# Patient Record
Sex: Female | Born: 2000 | Hispanic: Yes | Marital: Single | Smoking: Never smoker
Health system: Southern US, Community
[De-identification: ages and names within clinical notes are randomized; demographics above are authoritative.]

## PROBLEM LIST (undated history)

## (undated) DIAGNOSIS — S060X0A Concussion without loss of consciousness, initial encounter: Secondary | ICD-10-CM

## (undated) DIAGNOSIS — O24419 Gestational diabetes mellitus in pregnancy, unspecified control: Secondary | ICD-10-CM

## (undated) HISTORY — DX: Concussion without loss of consciousness, initial encounter: S06.0X0A

## (undated) HISTORY — DX: Gestational diabetes mellitus in pregnancy, unspecified control: O24.419

## (undated) HISTORY — PX: CHOLECYSTECTOMY: SHX55

---
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
## 2021-11-03 DIAGNOSIS — N926 Irregular menstruation, unspecified: Secondary | ICD-10-CM | POA: Diagnosis not present

## 2021-11-19 DIAGNOSIS — O209 Hemorrhage in early pregnancy, unspecified: Secondary | ICD-10-CM | POA: Diagnosis not present

## 2021-11-19 DIAGNOSIS — Z3A10 10 weeks gestation of pregnancy: Secondary | ICD-10-CM | POA: Diagnosis not present

## 2021-11-19 DIAGNOSIS — O26891 Other specified pregnancy related conditions, first trimester: Secondary | ICD-10-CM | POA: Diagnosis not present

## 2021-11-19 DIAGNOSIS — N939 Abnormal uterine and vaginal bleeding, unspecified: Secondary | ICD-10-CM | POA: Diagnosis not present

## 2021-11-19 DIAGNOSIS — Z3A01 Less than 8 weeks gestation of pregnancy: Secondary | ICD-10-CM | POA: Diagnosis not present

## 2021-12-13 ENCOUNTER — Encounter: Payer: Self-pay | Admitting: Obstetrics and Gynecology

## 2021-12-13 NOTE — Progress Notes (Unsigned)
   History:   Kristina Ferguson is a 21 y.o. G1P0 at [redacted]w[redacted]d early ultrasound being seen today for her first obstetrical visit.    Patient reports no complaints.      HISTORY: OB History  Gravida Para Term Preterm AB Living  1 0 0 0 0 0  SAB IAB Ectopic Multiple Live Births  0 0 0 0 0    # Outcome Date GA Lbr Len/2nd Weight Sex Delivery Anes PTL Lv  1 Current              Past Medical History:  Diagnosis Date   Concussion without loss of consciousness 09/24/2014   Past Surgical History:  Procedure Laterality Date   CHOLECYSTECTOMY     Family History  Problem Relation Age of Onset   Hypertension Mother    Stroke Maternal Grandmother    Hypertension Maternal Grandmother    Social History   Tobacco Use   Smoking status: Never   Smokeless tobacco: Never  Vaping Use   Vaping Use: Never used  Substance Use Topics   Alcohol use: No   Drug use: No   No Known Allergies Current Outpatient Medications on File Prior to Visit  Medication Sig Dispense Refill   Prenatal Vit-Fe Fumarate-FA (PRENATAL VITAMINS PO) Take by mouth.     No current facility-administered medications on file prior to visit.    Review of Systems Pertinent items noted in HPI and remainder of comprehensive ROS otherwise negative.  Physical Exam:   Vitals:   12/15/21 0924  BP: 130/76  Pulse: 80  Weight: 262 lb (118.8 kg)   Fetal Heart Rate (bpm): 176 General: well-developed, well-nourished female in no acute distress  Breasts:  Deferred  Skin: normal coloration and turgor, no rashes  Neurologic: oriented, normal, negative, normal mood  Extremities: normal strength, tone, and muscle mass, ROM of all joints is normal  HEENT PERRLA, extraocular movement intact and sclera clear, anicteric  Neck supple and no masses  Cardiovascular: regular rate and rhythm  Respiratory:  no respiratory distress, normal breath sounds  Abdomen: soft, non-tender; bowel sounds normal; no masses,  no  organomegaly  Pelvic: Deferred    Assessment:    Pregnancy: No obstetric history on file. Patient Active Problem List   Diagnosis Date Noted   Supervision of normal first pregnancy, antepartum 12/15/2021   Obesity (BMI 30-39.9) 09/17/2014     Plan:    1. Encounter for supervision of normal first pregnancy in first trimester Initial labs drawn. Discussed ldASA at 12 weeks for obesity and primiparous. Continue prenatal vitamins. Problem list reviewed and updated. Genetic Screening discussed, NIPS: ordered. Ultrasound discussed; fetal anatomic survey: ordered. Anticipatory guidance about prenatal visits given including labs, ultrasounds, and testing. Discussed usage of Babyscripts and virtual visits  The nature of [place] [facility] with multiple MDs and Advanced Practice Providers was explained to patient; also emphasized that residents, students are part of our team. Routine obstetric precautions reviewed. Encouraged to seek out care at office or emergency room [facility] preferred) for urgent and/or emergent concerns. Return in about 4 weeks (around 01/12/2022) for OB VISIT, MD or APP.    Milas Hock, MD, FACOG Obstetrician & Gynecologist, [facility] for [facility], [facility]

## 2021-12-15 ENCOUNTER — Encounter: Payer: Self-pay | Admitting: General Practice

## 2021-12-15 ENCOUNTER — Other Ambulatory Visit (HOSPITAL_COMMUNITY)
Admission: RE | Admit: 2021-12-15 | Discharge: 2021-12-15 | Disposition: A | Discharge status: Home / Self Care | Payer: Medicaid Other | Source: Ambulatory Visit | Attending: Obstetrics and Gynecology | Admitting: Obstetrics and Gynecology

## 2021-12-15 ENCOUNTER — Ambulatory Visit (INDEPENDENT_AMBULATORY_CARE_PROVIDER_SITE_OTHER): Payer: Medicaid Other | Admitting: Obstetrics and Gynecology

## 2021-12-15 ENCOUNTER — Encounter: Payer: Self-pay | Admitting: Obstetrics and Gynecology

## 2021-12-15 VITALS — BP 130/76 | HR 80 | Wt 262.0 lb

## 2021-12-15 DIAGNOSIS — O0990 Supervision of high risk pregnancy, unspecified, unspecified trimester: Secondary | ICD-10-CM | POA: Insufficient documentation

## 2021-12-15 DIAGNOSIS — E669 Obesity, unspecified: Secondary | ICD-10-CM

## 2021-12-15 DIAGNOSIS — Z3401 Encounter for supervision of normal first pregnancy, first trimester: Secondary | ICD-10-CM

## 2021-12-15 DIAGNOSIS — Z3A11 11 weeks gestation of pregnancy: Secondary | ICD-10-CM

## 2021-12-15 DIAGNOSIS — Z3400 Encounter for supervision of normal first pregnancy, unspecified trimester: Secondary | ICD-10-CM | POA: Diagnosis not present

## 2021-12-15 DIAGNOSIS — Z789 Other specified health status: Secondary | ICD-10-CM | POA: Diagnosis not present

## 2021-12-15 MED ORDER — ASPIRIN 81 MG PO TBEC: 81.0000 mg | DELAYED_RELEASE_TABLET | Freq: Every day | ORAL | 3 refills | Status: DC

## 2021-12-16 LAB — CBC/D/PLT+RPR+RH+ABO+RUBIGG...
Antibody Screen: NEGATIVE
Basophils Absolute: 0 10*3/uL (ref 0.0–0.2)
Basos: 1 %
EOS (ABSOLUTE): 0.3 10*3/uL (ref 0.0–0.4)
Eos: 3 %
HCV Ab: NONREACTIVE
HIV Screen 4th Generation wRfx: NONREACTIVE
Hematocrit: 37.3 % (ref 34.0–46.6)
Hemoglobin: 13.1 g/dL (ref 11.1–15.9)
Hepatitis B Surface Ag: NEGATIVE
Immature Grans (Abs): 0 10*3/uL (ref 0.0–0.1)
Immature Granulocytes: 0 %
Lymphocytes Absolute: 2.5 10*3/uL (ref 0.7–3.1)
Lymphs: 30 %
MCH: 31 pg (ref 26.6–33.0)
MCHC: 35.1 g/dL (ref 31.5–35.7)
MCV: 88 fL (ref 79–97)
Monocytes Absolute: 0.7 10*3/uL (ref 0.1–0.9)
Monocytes: 9 %
Neutrophils Absolute: 4.8 10*3/uL (ref 1.4–7.0)
Neutrophils: 57 %
Platelets: 359 10*3/uL (ref 150–450)
RBC: 4.23 x10E6/uL (ref 3.77–5.28)
RDW: 12.9 % (ref 11.7–15.4)
RPR Ser Ql: NONREACTIVE
Rh Factor: POSITIVE
Rubella Antibodies, IGG: 2.11 index (ref >0.99)
WBC: 8.3 10*3/uL (ref 3.4–10.8)

## 2021-12-16 LAB — HCV INTERPRETATION

## 2021-12-16 LAB — HEMOGLOBIN A1C
Est. average glucose Bld gHb Est-mCnc: 117 mg/dL
Hgb A1c MFr Bld: 5.7 % — ABNORMAL HIGH (ref 4.8–5.6)

## 2021-12-16 LAB — GC/CHLAMYDIA PROBE AMP (~~LOC~~) NOT AT ARMC
Chlamydia: NEGATIVE
Comment: NEGATIVE
Comment: NORMAL
Neisseria Gonorrhea: NEGATIVE

## 2021-12-17 ENCOUNTER — Telehealth: Payer: Self-pay

## 2021-12-17 LAB — CULTURE, OB URINE

## 2021-12-17 LAB — URINE CULTURE, OB REFLEX

## 2021-12-17 NOTE — Telephone Encounter (Signed)
Called patient and informed her that her hgb A1c was elevated and Dr. Para March would like for her to do an early glucola.   Patient phone call ended before I could schedule her.  I attempted to call her back and it went to voicemail Armandina Stammer RN

## 2021-12-17 NOTE — Telephone Encounter (Signed)
-----   Message from Milas Hock, MD sent at 12/16/2021  6:01 PM EDT ----- She needs to come in for early 2 hr, thanks, pad

## 2021-12-19 LAB — PANORAMA PRENATAL TEST FULL PANEL:PANORAMA TEST PLUS 5 ADDITIONAL MICRODELETIONS: FETAL FRACTION: 4.1

## 2021-12-22 ENCOUNTER — Ambulatory Visit (INDEPENDENT_AMBULATORY_CARE_PROVIDER_SITE_OTHER): Payer: Medicaid Other

## 2021-12-22 ENCOUNTER — Encounter: Payer: Self-pay | Admitting: Obstetrics and Gynecology

## 2021-12-22 DIAGNOSIS — R7309 Other abnormal glucose: Secondary | ICD-10-CM

## 2021-12-22 DIAGNOSIS — Z3A12 12 weeks gestation of pregnancy: Secondary | ICD-10-CM | POA: Diagnosis not present

## 2021-12-22 DIAGNOSIS — Z3402 Encounter for supervision of normal first pregnancy, second trimester: Secondary | ICD-10-CM

## 2021-12-22 NOTE — Progress Notes (Signed)
Pt presents for early 2 hr GTT.Pt was sent to the lab for Glucose test. Rayanne Padmanabhan l Hridhaan Yohn, CMA

## 2021-12-23 ENCOUNTER — Encounter: Payer: Self-pay | Admitting: Family Medicine

## 2021-12-23 ENCOUNTER — Telehealth: Payer: Self-pay

## 2021-12-23 DIAGNOSIS — O24410 Gestational diabetes mellitus in pregnancy, diet controlled: Secondary | ICD-10-CM

## 2021-12-23 DIAGNOSIS — O24419 Gestational diabetes mellitus in pregnancy, unspecified control: Secondary | ICD-10-CM | POA: Insufficient documentation

## 2021-12-23 LAB — GLUCOSE TOLERANCE, 2 HOURS W/ 1HR
Glucose, 1 hour: 125 mg/dL (ref 70–179)
Glucose, 2 hour: 105 mg/dL (ref 70–152)
Glucose, Fasting: 94 mg/dL — ABNORMAL HIGH (ref 70–91)

## 2021-12-23 MED ORDER — ACCU-CHEK GUIDE VI STRP: ORAL_STRIP | 12 refills | Status: DC

## 2021-12-23 MED ORDER — ACCU-CHEK SOFTCLIX LANCETS MISC: 12 refills | Status: DC

## 2021-12-23 MED ORDER — ACCU-CHEK GUIDE W/DEVICE KIT: PACK | 0 refills | Status: DC

## 2021-12-23 NOTE — Telephone Encounter (Signed)
Testing supplies were sent to her pharmacy. Trejuan Matherne l Tayten Heber, CMA

## 2021-12-23 NOTE — Telephone Encounter (Signed)
-----   Message from Reva Bores, MD sent at 12/23/2021  2:51 PM EDT ----- Has GDM--please book for D and N mgmt

## 2021-12-23 NOTE — Telephone Encounter (Signed)
Called patient to inform her that she failed her early GTT. Patient made aware that she has Gestational Diabetes and will be be referred to Diabetes education. Understanding was voiced. Emmerson Taddei l Wilfrid Hyser, CMA

## 2021-12-24 ENCOUNTER — Encounter: Payer: Medicaid Other | Admitting: Registered"

## 2021-12-30 LAB — HORIZON CUSTOM: REPORT SUMMARY: NEGATIVE

## 2022-01-15 ENCOUNTER — Ambulatory Visit (INDEPENDENT_AMBULATORY_CARE_PROVIDER_SITE_OTHER): Payer: Medicaid Other | Admitting: Obstetrics & Gynecology

## 2022-01-15 VITALS — BP 135/79 | HR 91 | Wt 266.0 lb

## 2022-01-15 DIAGNOSIS — O24410 Gestational diabetes mellitus in pregnancy, diet controlled: Secondary | ICD-10-CM

## 2022-01-15 DIAGNOSIS — O0992 Supervision of high risk pregnancy, unspecified, second trimester: Secondary | ICD-10-CM

## 2022-01-15 DIAGNOSIS — R82998 Other abnormal findings in urine: Secondary | ICD-10-CM

## 2022-01-15 DIAGNOSIS — Z3A15 15 weeks gestation of pregnancy: Secondary | ICD-10-CM | POA: Diagnosis not present

## 2022-01-15 LAB — POCT URINALYSIS DIPSTICK OB
Bilirubin, UA: NEGATIVE
Glucose, UA: NEGATIVE
Ketones, UA: NEGATIVE
Nitrite, UA: NEGATIVE
Spec Grav, UA: 1.025 (ref 1.010–1.025)
Urobilinogen, UA: 0.2 E.U./dL
pH, UA: 5 (ref 5.0–8.0)

## 2022-01-15 NOTE — Addendum Note (Signed)
Addended by: Lorelle Gibbs L on: 01/15/2022 03:22 PM   Modules accepted: Orders

## 2022-01-15 NOTE — Progress Notes (Signed)
   PRENATAL VISIT NOTE  Subjective:  Kristina Ferguson is a 21 y.o. G1P0 at [redacted]w[redacted]d being seen today for ongoing prenatal care.  She is currently monitored for the following issues for this high-risk pregnancy and has Morbid obesity (HCC); Supervision of high-risk pregnancy; and Gestational diabetes on their problem list.  Patient reports no complaints.  She is not taking ASA as it causes her headaches.  Contractions: Not present. Vag. Bleeding: None.  Movement: Absent. Denies leaking of fluid.   The following portions of the patient's history were reviewed and updated as appropriate: allergies, current medications, past family history, past medical history, past social history, past surgical history and problem list.   Objective:   Vitals:   01/15/22 1421  BP: 135/79  Pulse: 91  Weight: 266 lb (120.7 kg)    Fetal Status: Fetal Heart Rate (bpm): 155   Movement: Absent     General:  Alert, oriented and cooperative. Patient is in no acute distress.  Skin: Skin is warm and dry. No rash noted.   Cardiovascular: Normal heart rate noted  Respiratory: Normal respiratory effort, no problems with respiration noted  Abdomen: Soft, gravid, appropriate for gestational age.  Pain/Pressure: Absent     Pelvic: Cervical exam deferred        Extremities: Normal range of motion.  Edema: None  Mental Status: Normal mood and affect. Normal behavior. Normal judgment and thought content.   Assessment and Plan:  Pregnancy: G1P0 at [redacted]w[redacted]d 1. Diet controlled gestational diabetes mellitus (GDM), antepartum Did not bring log or meter but reports good BS. Told to bring next visit for review. Anatomy scan scheduled, will schedule fetal ECHO given early diagnosis. Aspirin added to allergy list given reported headaches, this happens even when she takes it with food. - US Fetal Echocardiography; Future  2. [redacted] weeks gestation of pregnancy 3. Supervision of high risk pregnancy in second trimester - AFP,  Serum, Open Spina Bifida No other complaints or concerns.  Routine obstetric precautions reviewed. Please refer to After Visit Summary for other counseling recommendations.   Return in about 4 weeks (around 02/12/2022) for OFFICE OB VISIT (MD only).  Future Appointments  Date Time Provider Department Center  02/06/2022 12:45 PM [facility] NURSE [facility]  02/06/2022  1:00 PM [facility] US1 [facility]  02/12/2022  2:30 PM St[place], Rhona Raider, DO [facility] None    Jaynie Collins, MD

## 2022-01-17 LAB — AFP, SERUM, OPEN SPINA BIFIDA
AFP MoM: 0.69
AFP Value: 16.2 ng/mL
Gest. Age on Collection Date: 15.6 weeks
Maternal Age At EDD: 20.6 yr
OSBR Risk 1 IN: 10000
Test Results:: NEGATIVE
Weight: 266 [lb_av]

## 2022-01-17 LAB — CULTURE, OB URINE

## 2022-01-17 LAB — URINE CULTURE, OB REFLEX

## 2022-02-06 ENCOUNTER — Ambulatory Visit: Payer: Medicaid Other | Admitting: *Deleted

## 2022-02-06 ENCOUNTER — Other Ambulatory Visit: Payer: Self-pay | Admitting: *Deleted

## 2022-02-06 ENCOUNTER — Ambulatory Visit: Payer: Medicaid Other | Attending: Obstetrics and Gynecology

## 2022-02-06 ENCOUNTER — Ambulatory Visit: Payer: Medicaid Other | Attending: Obstetrics and Gynecology | Admitting: Obstetrics and Gynecology

## 2022-02-06 VITALS — BP 128/73 | HR 91

## 2022-02-06 DIAGNOSIS — O24410 Gestational diabetes mellitus in pregnancy, diet controlled: Secondary | ICD-10-CM | POA: Diagnosis not present

## 2022-02-06 DIAGNOSIS — O99212 Obesity complicating pregnancy, second trimester: Secondary | ICD-10-CM

## 2022-02-06 DIAGNOSIS — O24419 Gestational diabetes mellitus in pregnancy, unspecified control: Secondary | ICD-10-CM

## 2022-02-06 DIAGNOSIS — Z3A19 19 weeks gestation of pregnancy: Secondary | ICD-10-CM

## 2022-02-06 DIAGNOSIS — Z3400 Encounter for supervision of normal first pregnancy, unspecified trimester: Secondary | ICD-10-CM

## 2022-02-06 DIAGNOSIS — Z363 Encounter for antenatal screening for malformations: Secondary | ICD-10-CM | POA: Diagnosis not present

## 2022-02-06 DIAGNOSIS — O0992 Supervision of high risk pregnancy, unspecified, second trimester: Secondary | ICD-10-CM

## 2022-02-06 NOTE — Progress Notes (Signed)
Maternal-Fetal Medicine   Name: Kristina Ferguson DOB: [birth year] MRN: [id number] Referring Provider: Verita Schneiders, MD  I had the pleasure of seeing [name] today at [facility]. She is G1 P0 at 19-weeks' gestation and is here for fetal anatomy scan.  On cell-free fetal DNA screening, the risks of aneuploidies are not increased. MSAFP screening showed low risk for open-neural tube defects.  She has early diagnosis of gestational diabetes.  Hemoglobin A1c was 5.7%.  Diabetes is reportedly well controlled on diet.  Patient is checking her blood glucose regularly and reports both her fasting and postprandial levels are within normal range. She does not have hypertension or any other chronic medical conditions.  She has class III obesity. Past surgical history is significant for cholecystectomy.  Ultrasound We performed fetal anatomy scan. An echogenic intracardiac focus is seen. No other makers of aneuploidies or fetal structural defects are seen. Fetal biometry is consistent with her previously-established dates. Amniotic fluid is normal and good fetal activity is seen. I informed the patient that given that she had low rik for fetal aneuploidies on cell-free fetal DNA screening, finding of echogenic intracardiac focus should be considered a normal variant and that the risk of trisomy 21 is not increased. I also reassured that echogenic focus does not increase the risk of cardiac defects. I did not recommend amniocentesis for this finding.  Gestational diabetes I explained the diagnosis of gestational diabetes.  I emphasized the importance of good blood glucose control to prevent adverse fetal or neonatal outcomes.  I discussed normal range of blood glucose values. Patient seems motivated and checks her blood glucose regularly. Possible complications of gestational diabetes include fetal macrosomia, shoulder dystocia and birth injuries, stillbirth (in  poorly controlled diabetes) and neonatal respiratory syndrome and other complications.  In about 85% of cases, gestational diabetes is well controlled by diet alone.  Exercise reduces the need for insulin.  Medical treatment includes oral hypoglycemics or insulin.  Timing of delivery: In well-controlled diabetes on diet, patient can be delivered at 2- or 40-weeks' gestation. Vaginal delivery is not contraindicated. Type 2 diabetes develops in up to 50% of women with GDM. I recommend postpartum screening with 75-g glucose load at 6 to 12 weeks after delivery.  Recommendations -An appointment was made for her to return in 5 weeks for completion of fetal anatomy. -Fetal growth assessments every 4 weeks. -Weekly BPP from [redacted] weeks gestation till delivery.  If patient requires oral hypoglycemics or insulin, we recommend weekly BPP from [redacted] weeks gestation till delivery.  Thank you for consultation.  If you have any questions or concerns, please contact me the [facility].  Consultation including face-to-face (more than 50%) counseling 30 minutes.

## 2022-02-12 ENCOUNTER — Encounter: Payer: Medicaid Other | Admitting: Family Medicine

## 2022-03-08 DIAGNOSIS — O4692 Antepartum hemorrhage, unspecified, second trimester: Secondary | ICD-10-CM | POA: Diagnosis not present

## 2022-03-08 DIAGNOSIS — Z3A23 23 weeks gestation of pregnancy: Secondary | ICD-10-CM | POA: Diagnosis not present

## 2022-03-12 ENCOUNTER — Encounter: Payer: Medicaid Other | Admitting: Family Medicine

## 2022-03-12 ENCOUNTER — Encounter: Payer: Self-pay | Admitting: General Practice

## 2022-03-13 ENCOUNTER — Ambulatory Visit: Payer: Medicaid Other | Admitting: *Deleted

## 2022-03-13 ENCOUNTER — Other Ambulatory Visit: Payer: Self-pay | Admitting: *Deleted

## 2022-03-13 ENCOUNTER — Ambulatory Visit: Payer: Medicaid Other | Attending: Obstetrics and Gynecology

## 2022-03-13 VITALS — BP 129/72 | HR 79

## 2022-03-13 DIAGNOSIS — O24419 Gestational diabetes mellitus in pregnancy, unspecified control: Secondary | ICD-10-CM | POA: Diagnosis not present

## 2022-03-13 DIAGNOSIS — O0992 Supervision of high risk pregnancy, unspecified, second trimester: Secondary | ICD-10-CM

## 2022-03-13 DIAGNOSIS — O99212 Obesity complicating pregnancy, second trimester: Secondary | ICD-10-CM

## 2022-03-13 DIAGNOSIS — O24410 Gestational diabetes mellitus in pregnancy, diet controlled: Secondary | ICD-10-CM | POA: Insufficient documentation

## 2022-03-13 DIAGNOSIS — E669 Obesity, unspecified: Secondary | ICD-10-CM | POA: Diagnosis not present

## 2022-03-13 DIAGNOSIS — Z3A24 24 weeks gestation of pregnancy: Secondary | ICD-10-CM | POA: Diagnosis not present

## 2022-03-13 DIAGNOSIS — O283 Abnormal ultrasonic finding on antenatal screening of mother: Secondary | ICD-10-CM

## 2022-03-13 DIAGNOSIS — Z362 Encounter for other antenatal screening follow-up: Secondary | ICD-10-CM

## 2022-04-09 ENCOUNTER — Ambulatory Visit: Payer: Medicaid Other | Attending: Obstetrics

## 2022-04-09 ENCOUNTER — Other Ambulatory Visit: Payer: Self-pay | Admitting: *Deleted

## 2022-04-09 ENCOUNTER — Ambulatory Visit: Payer: Medicaid Other | Admitting: *Deleted

## 2022-04-09 VITALS — BP 127/76 | HR 105

## 2022-04-09 DIAGNOSIS — O0992 Supervision of high risk pregnancy, unspecified, second trimester: Secondary | ICD-10-CM | POA: Insufficient documentation

## 2022-04-09 DIAGNOSIS — Z3A27 27 weeks gestation of pregnancy: Secondary | ICD-10-CM

## 2022-04-09 DIAGNOSIS — E669 Obesity, unspecified: Secondary | ICD-10-CM | POA: Diagnosis not present

## 2022-04-09 DIAGNOSIS — O24410 Gestational diabetes mellitus in pregnancy, diet controlled: Secondary | ICD-10-CM | POA: Insufficient documentation

## 2022-04-09 DIAGNOSIS — O283 Abnormal ultrasonic finding on antenatal screening of mother: Secondary | ICD-10-CM | POA: Diagnosis not present

## 2022-04-09 DIAGNOSIS — O99212 Obesity complicating pregnancy, second trimester: Secondary | ICD-10-CM | POA: Diagnosis not present

## 2022-04-09 DIAGNOSIS — O99213 Obesity complicating pregnancy, third trimester: Secondary | ICD-10-CM

## 2022-04-09 DIAGNOSIS — Z362 Encounter for other antenatal screening follow-up: Secondary | ICD-10-CM | POA: Diagnosis not present

## 2022-04-16 ENCOUNTER — Telehealth: Payer: Self-pay | Admitting: General Practice

## 2022-04-16 ENCOUNTER — Encounter: Payer: Medicaid Other | Admitting: Family Medicine

## 2022-04-16 NOTE — Telephone Encounter (Signed)
Patient has missed last couple of appointments.  Called patient but no answer.  Left message on VM for patient to contact our office as soon as possible.

## 2022-04-30 ENCOUNTER — Encounter: Payer: Medicaid Other | Admitting: Family Medicine

## 2022-04-30 ENCOUNTER — Telehealth: Payer: Self-pay | Admitting: General Practice

## 2022-04-30 NOTE — Telephone Encounter (Signed)
Left message on VM for patient to contact our office to schedule appt.  Pt has missed the last 4 appointments.

## 2022-05-03 DIAGNOSIS — J101 Influenza due to other identified influenza virus with other respiratory manifestations: Secondary | ICD-10-CM | POA: Diagnosis not present

## 2022-05-03 DIAGNOSIS — Z20822 Contact with and (suspected) exposure to covid-19: Secondary | ICD-10-CM | POA: Diagnosis not present

## 2022-05-03 DIAGNOSIS — J069 Acute upper respiratory infection, unspecified: Secondary | ICD-10-CM | POA: Diagnosis not present

## 2022-05-03 DIAGNOSIS — H9201 Otalgia, right ear: Secondary | ICD-10-CM | POA: Diagnosis not present

## 2022-05-04 DIAGNOSIS — J101 Influenza due to other identified influenza virus with other respiratory manifestations: Secondary | ICD-10-CM | POA: Diagnosis not present

## 2022-05-04 DIAGNOSIS — Z20822 Contact with and (suspected) exposure to covid-19: Secondary | ICD-10-CM | POA: Diagnosis not present

## 2022-05-04 NOTE — L&D Delivery Note (Signed)
OB/GYN Faculty Practice Delivery Note  Kristina Ferguson is a 22 y.o. G1P0 s/p VD at [redacted]w[redacted]d She was admitted for PPROM.   ROM: 44h 560mith clear fluid GBS Status:  NEGATIVE/-- (01/27 0022) Maximum Maternal Temperature: 98.6  Labor Progress: Initial SVE: 1/60/-2. She then progressed to complete.   Delivery Date/Time: 05/31/2022 @1923$  Delivery: Called to room and patient was complete. This provider started pushing with the patient. NICU team present. Head delivered LOA. Loose nuchal cord present x1 note reduced. Shoulder not easily delivered with gentle downward traction. Shoulder was noted to be impacted behind maternal pubic bone.  Shoulder dystocia events: Delivery of the head: 03/30/2020  9:15 AM First maneuver: 03/30/2020  9:15 AM, [place]ts Second maneuver: 03/30/2020  9:16 AM, [place]ts Posterior arm delivered+  The body delivered in usual fashion. Infant placed on mother's abdomen, dried and vigorously stimulated. Cord clamped x 2 swiftly, and cut by this provider. Infant handed over to NICU team. Cord gases collected and pending. Cord blood drawn. Placenta delivered spontaneously with gentle cord traction. Fundus firm with massage and Pitocin. Labia, perineum, vagina, and cervix inspected with evidence of deep 2nd degree perineal laceration.  Baby Weight: pending  Placenta: 3 vessel, intact. Sent to pathology Complications: Shoulder dystocia  Lacerations: 2nd degree perineal repaired with 2.0, 3.0 vicryl  EBL: 76 mL Analgesia: Epidural   Infant:  APGAR (1 MIN): 4   APGAR (5 MINS): 8    SiGerlene FeeDO OB Fellow, [facility] Wo[place]arsing/28/2024, 8:44 PM

## 2022-05-29 ENCOUNTER — Encounter (HOSPITAL_COMMUNITY): Payer: Self-pay | Admitting: Obstetrics & Gynecology

## 2022-05-29 ENCOUNTER — Inpatient Hospital Stay (HOSPITAL_COMMUNITY)
Admission: AD | Admit: 2022-05-29 | Discharge: 2022-06-02 | DRG: 807 | Disposition: A | Discharge status: Home / Self Care | Payer: Medicaid Other | Attending: Obstetrics and Gynecology | Admitting: Obstetrics and Gynecology

## 2022-05-29 DIAGNOSIS — O24419 Gestational diabetes mellitus in pregnancy, unspecified control: Secondary | ICD-10-CM | POA: Diagnosis present

## 2022-05-29 DIAGNOSIS — O133 Gestational [pregnancy-induced] hypertension without significant proteinuria, third trimester: Secondary | ICD-10-CM

## 2022-05-29 DIAGNOSIS — O24420 Gestational diabetes mellitus in childbirth, diet controlled: Secondary | ICD-10-CM | POA: Diagnosis present

## 2022-05-29 DIAGNOSIS — O42913 Preterm premature rupture of membranes, unspecified as to length of time between rupture and onset of labor, third trimester: Principal | ICD-10-CM | POA: Diagnosis present

## 2022-05-29 DIAGNOSIS — O1414 Severe pre-eclampsia complicating childbirth: Secondary | ICD-10-CM | POA: Diagnosis present

## 2022-05-29 DIAGNOSIS — O1410 Severe pre-eclampsia, unspecified trimester: Secondary | ICD-10-CM | POA: Diagnosis present

## 2022-05-29 DIAGNOSIS — O0933 Supervision of pregnancy with insufficient antenatal care, third trimester: Secondary | ICD-10-CM

## 2022-05-29 DIAGNOSIS — O99214 Obesity complicating childbirth: Secondary | ICD-10-CM | POA: Diagnosis present

## 2022-05-29 DIAGNOSIS — Z23 Encounter for immunization: Secondary | ICD-10-CM

## 2022-05-29 DIAGNOSIS — O0992 Supervision of high risk pregnancy, unspecified, second trimester: Principal | ICD-10-CM

## 2022-05-29 DIAGNOSIS — O24410 Gestational diabetes mellitus in pregnancy, diet controlled: Secondary | ICD-10-CM

## 2022-05-29 DIAGNOSIS — O42919 Preterm premature rupture of membranes, unspecified as to length of time between rupture and onset of labor, unspecified trimester: Secondary | ICD-10-CM | POA: Diagnosis present

## 2022-05-29 DIAGNOSIS — Z3A35 35 weeks gestation of pregnancy: Secondary | ICD-10-CM

## 2022-05-29 DIAGNOSIS — Z79899 Other long term (current) drug therapy: Secondary | ICD-10-CM

## 2022-05-29 NOTE — MAU Note (Signed)
Pt says SROM - 1030pm- while sitting on sofa- stood - felt a gush and fluid still coming out [facility] - switched to [facility]-  Last visit - Dec- 2023 U/S. Last seen by Dr - Nov.  Feel lower abd pain Denies HSV GBS- not collected

## 2022-05-30 ENCOUNTER — Encounter (HOSPITAL_COMMUNITY): Payer: Self-pay | Admitting: Family Medicine

## 2022-05-30 ENCOUNTER — Other Ambulatory Visit: Payer: Self-pay

## 2022-05-30 DIAGNOSIS — O42913 Preterm premature rupture of membranes, unspecified as to length of time between rupture and onset of labor, third trimester: Secondary | ICD-10-CM | POA: Diagnosis not present

## 2022-05-30 DIAGNOSIS — O24420 Gestational diabetes mellitus in childbirth, diet controlled: Secondary | ICD-10-CM | POA: Diagnosis not present

## 2022-05-30 DIAGNOSIS — O99214 Obesity complicating childbirth: Secondary | ICD-10-CM | POA: Diagnosis not present

## 2022-05-30 DIAGNOSIS — O42013 Preterm premature rupture of membranes, onset of labor within 24 hours of rupture, third trimester: Secondary | ICD-10-CM | POA: Diagnosis not present

## 2022-05-30 DIAGNOSIS — O1410 Severe pre-eclampsia, unspecified trimester: Secondary | ICD-10-CM | POA: Diagnosis present

## 2022-05-30 DIAGNOSIS — Z79899 Other long term (current) drug therapy: Secondary | ICD-10-CM | POA: Diagnosis not present

## 2022-05-30 DIAGNOSIS — O1414 Severe pre-eclampsia complicating childbirth: Secondary | ICD-10-CM | POA: Diagnosis not present

## 2022-05-30 DIAGNOSIS — O0933 Supervision of pregnancy with insufficient antenatal care, third trimester: Secondary | ICD-10-CM

## 2022-05-30 DIAGNOSIS — O42919 Preterm premature rupture of membranes, unspecified as to length of time between rupture and onset of labor, unspecified trimester: Secondary | ICD-10-CM | POA: Diagnosis present

## 2022-05-30 DIAGNOSIS — Z23 Encounter for immunization: Secondary | ICD-10-CM | POA: Diagnosis not present

## 2022-05-30 DIAGNOSIS — Z3A35 35 weeks gestation of pregnancy: Secondary | ICD-10-CM | POA: Diagnosis not present

## 2022-05-30 LAB — COMPREHENSIVE METABOLIC PANEL
ALT: 17 U/L (ref 0–44)
ALT: 19 U/L (ref 0–44)
AST: 21 U/L (ref 15–41)
AST: 23 U/L (ref 15–41)
Albumin: 2.6 g/dL — ABNORMAL LOW (ref 3.5–5.0)
Albumin: 2.6 g/dL — ABNORMAL LOW (ref 3.5–5.0)
Alkaline Phosphatase: 114 U/L (ref 38–126)
Alkaline Phosphatase: 116 U/L (ref 38–126)
Anion gap: 12 (ref 5–15)
Anion gap: 11 (ref 5–15)
BUN: 10 mg/dL (ref 6–20)
BUN: 8 mg/dL (ref 6–20)
CO2: 18 mmol/L — ABNORMAL LOW (ref 22–32)
CO2: 19 mmol/L — ABNORMAL LOW (ref 22–32)
Calcium: 8.8 mg/dL — ABNORMAL LOW (ref 8.9–10.3)
Calcium: 8.8 mg/dL — ABNORMAL LOW (ref 8.9–10.3)
Chloride: 107 mmol/L (ref 98–111)
Chloride: 104 mmol/L (ref 98–111)
Creatinine, Ser: 0.48 mg/dL (ref 0.44–1.00)
Creatinine, Ser: 0.51 mg/dL (ref 0.44–1.00)
GFR, Estimated: >60 mL/min (ref >60)
GFR, Estimated: >60 mL/min (ref >60)
Glucose, Bld: 112 mg/dL — ABNORMAL HIGH (ref 70–99)
Glucose, Bld: 91 mg/dL (ref 70–99)
Potassium: 3.4 mmol/L — ABNORMAL LOW (ref 3.5–5.1)
Potassium: 3.7 mmol/L (ref 3.5–5.1)
Sodium: 137 mmol/L (ref 135–145)
Sodium: 134 mmol/L — ABNORMAL LOW (ref 135–145)
Total Bilirubin: 0.4 mg/dL (ref 0.3–1.2)
Total Bilirubin: 0.4 mg/dL (ref 0.3–1.2)
Total Protein: 6.3 g/dL — ABNORMAL LOW (ref 6.5–8.1)
Total Protein: 5.9 g/dL — ABNORMAL LOW (ref 6.5–8.1)

## 2022-05-30 LAB — GLUCOSE, CAPILLARY
Glucose-Capillary: 106 mg/dL — ABNORMAL HIGH (ref 70–99)
Glucose-Capillary: 123 mg/dL — ABNORMAL HIGH (ref 70–99)
Glucose-Capillary: 95 mg/dL (ref 70–99)
Glucose-Capillary: 137 mg/dL — ABNORMAL HIGH (ref 70–99)

## 2022-05-30 LAB — CBC
HCT: 38.3 % (ref 36.0–46.0)
HCT: 37.2 % (ref 36.0–46.0)
Hemoglobin: 12.8 g/dL (ref 12.0–15.0)
Hemoglobin: 12.6 g/dL (ref 12.0–15.0)
MCH: 29.9 pg (ref 26.0–34.0)
MCH: 30.1 pg (ref 26.0–34.0)
MCHC: 33.4 g/dL (ref 30.0–36.0)
MCHC: 33.9 g/dL (ref 30.0–36.0)
MCV: 89.5 fL (ref 80.0–100.0)
MCV: 89 fL (ref 80.0–100.0)
Platelets: 379 10*3/uL (ref 150–400)
Platelets: 387 10*3/uL (ref 150–400)
RBC: 4.28 MIL/uL (ref 3.87–5.11)
RBC: 4.18 MIL/uL (ref 3.87–5.11)
RDW: 13.7 % (ref 11.5–15.5)
RDW: 14 % (ref 11.5–15.5)
WBC: 14.3 10*3/uL — ABNORMAL HIGH (ref 4.0–10.5)
WBC: 10.3 10*3/uL (ref 4.0–10.5)
nRBC: 0 % (ref 0.0–0.2)
nRBC: 0 % (ref 0.0–0.2)

## 2022-05-30 LAB — RPR: RPR Ser Ql: NONREACTIVE

## 2022-05-30 LAB — TYPE AND SCREEN
ABO/RH(D): A POS
Antibody Screen: NEGATIVE

## 2022-05-30 LAB — PROTEIN / CREATININE RATIO, URINE
Creatinine, Urine: 176 mg/dL
Protein Creatinine Ratio: 0.47 mg/mg{Cre} — ABNORMAL HIGH (ref 0.00–0.15)
Total Protein, Urine: 83 mg/dL

## 2022-05-30 LAB — GROUP B STREP BY PCR: Group B strep by PCR: NEGATIVE

## 2022-05-30 LAB — POCT FERN TEST: POCT Fern Test: POSITIVE

## 2022-05-30 MED ORDER — ACETAMINOPHEN 325 MG PO TABS
650.0000 mg | ORAL_TABLET | ORAL | Status: DC | PRN
  Administered 2022-05-31: 650 mg via ORAL
  Filled 2022-05-30: qty 2

## 2022-05-30 MED ORDER — LABETALOL HCL 5 MG/ML IV SOLN: 80.0000 mg | INTRAVENOUS | Status: DC | PRN

## 2022-05-30 MED ORDER — DIPHENHYDRAMINE HCL 50 MG/ML IJ SOLN: 12.5000 mg | INTRAMUSCULAR | Status: DC | PRN

## 2022-05-30 MED ORDER — HYDRALAZINE HCL 20 MG/ML IJ SOLN: 10.0000 mg | INTRAMUSCULAR | Status: DC | PRN

## 2022-05-30 MED ORDER — PHENYLEPHRINE 80 MCG/ML (10ML) SYRINGE FOR IV PUSH (FOR BLOOD PRESSURE SUPPORT): 80.0000 ug | PREFILLED_SYRINGE | INTRAVENOUS | Status: DC | PRN

## 2022-05-30 MED ORDER — OXYCODONE-ACETAMINOPHEN 5-325 MG PO TABS: 2.0000 | ORAL_TABLET | ORAL | Status: DC | PRN

## 2022-05-30 MED ORDER — SODIUM CHLORIDE 0.9 % IV SOLN
5.0000 10*6.[IU] | Freq: Once | INTRAVENOUS | Status: AC
  Administered 2022-05-30: 5 10*6.[IU] via INTRAVENOUS
  Filled 2022-05-30: qty 5

## 2022-05-30 MED ORDER — ONDANSETRON HCL 4 MG/2ML IJ SOLN: 4.0000 mg | Freq: Four times a day (QID) | INTRAMUSCULAR | Status: DC | PRN

## 2022-05-30 MED ORDER — MISOPROSTOL 50MCG HALF TABLET
50.0000 ug | ORAL_TABLET | Freq: Once | ORAL | Status: AC
  Administered 2022-05-30: 50 ug via ORAL
  Filled 2022-05-30: qty 1

## 2022-05-30 MED ORDER — EPHEDRINE 5 MG/ML INJ: 10.0000 mg | INTRAVENOUS | Status: DC | PRN

## 2022-05-30 MED ORDER — OXYTOCIN-SODIUM CHLORIDE 30-0.9 UT/500ML-% IV SOLN
1.0000 m[IU]/min | INTRAVENOUS | Status: DC
  Administered 2022-05-30 – 2022-05-31 (×2): 2 m[IU]/min via INTRAVENOUS
  Filled 2022-05-30: qty 500

## 2022-05-30 MED ORDER — OXYTOCIN-SODIUM CHLORIDE 30-0.9 UT/500ML-% IV SOLN
2.5000 [IU]/h | INTRAVENOUS | Status: DC
  Administered 2022-05-31: 2.5 [IU]/h via INTRAVENOUS
  Filled 2022-05-30: qty 500

## 2022-05-30 MED ORDER — MISOPROSTOL 25 MCG QUARTER TABLET
25.0000 ug | ORAL_TABLET | Freq: Once | ORAL | Status: AC
  Administered 2022-05-30: 25 ug via VAGINAL
  Filled 2022-05-30: qty 1

## 2022-05-30 MED ORDER — TERBUTALINE SULFATE 1 MG/ML IJ SOLN
0.2500 mg | Freq: Once | INTRAMUSCULAR | Status: AC | PRN
  Administered 2022-05-31: 0.25 mg via SUBCUTANEOUS
  Filled 2022-05-30: qty 1

## 2022-05-30 MED ORDER — OXYTOCIN BOLUS FROM INFUSION
333.0000 mL | Freq: Once | INTRAVENOUS | Status: AC
  Administered 2022-05-31: 333 mL via INTRAVENOUS

## 2022-05-30 MED ORDER — LABETALOL HCL 5 MG/ML IV SOLN
20.0000 mg | INTRAVENOUS | Status: DC | PRN
  Administered 2022-05-30: 20 mg via INTRAVENOUS

## 2022-05-30 MED ORDER — LACTATED RINGERS IV SOLN: INTRAVENOUS | Status: DC

## 2022-05-30 MED ORDER — LACTATED RINGERS IV SOLN: 500.0000 mL | Freq: Once | INTRAVENOUS | Status: DC

## 2022-05-30 MED ORDER — LACTATED RINGERS IV SOLN: 500.0000 mL | INTRAVENOUS | Status: DC | PRN

## 2022-05-30 MED ORDER — LABETALOL HCL 5 MG/ML IV SOLN
20.0000 mg | INTRAVENOUS | Status: DC | PRN
  Administered 2022-05-30: 20 mg via INTRAVENOUS
  Filled 2022-05-30 (×2): qty 4

## 2022-05-30 MED ORDER — MISOPROSTOL 50MCG HALF TABLET
50.0000 ug | ORAL_TABLET | Freq: Once | ORAL | Status: AC
  Administered 2022-05-30: 50 ug via ORAL

## 2022-05-30 MED ORDER — OXYCODONE-ACETAMINOPHEN 5-325 MG PO TABS: 1.0000 | ORAL_TABLET | ORAL | Status: DC | PRN

## 2022-05-30 MED ORDER — PENICILLIN G POT IN DEXTROSE 60000 UNIT/ML IV SOLN
3.0000 10*6.[IU] | INTRAVENOUS | Status: DC
  Administered 2022-05-30 – 2022-05-31 (×9): 3 10*6.[IU] via INTRAVENOUS
  Filled 2022-05-30 (×9): qty 50

## 2022-05-30 MED ORDER — LABETALOL HCL 5 MG/ML IV SOLN
40.0000 mg | INTRAVENOUS | Status: DC | PRN
  Administered 2022-05-30: 40 mg via INTRAVENOUS

## 2022-05-30 MED ORDER — LIDOCAINE HCL (PF) 1 % IJ SOLN: 30.0000 mL | INTRAMUSCULAR | Status: DC | PRN

## 2022-05-30 MED ORDER — LABETALOL HCL 5 MG/ML IV SOLN
40.0000 mg | INTRAVENOUS | Status: DC | PRN
  Filled 2022-05-30: qty 8

## 2022-05-30 MED ORDER — FENTANYL-BUPIVACAINE-NACL 0.5-0.125-0.9 MG/250ML-% EP SOLN
12.0000 mL/h | EPIDURAL | Status: DC | PRN
  Administered 2022-05-31: 12 mL/h via EPIDURAL
  Filled 2022-05-30: qty 250

## 2022-05-30 MED ORDER — SOD CITRATE-CITRIC ACID 500-334 MG/5ML PO SOLN: 30.0000 mL | ORAL | Status: DC | PRN

## 2022-05-30 NOTE — Progress Notes (Signed)
Patient ID: Kristina Ferguson, female   DOB: April 08, 2001, 22 y.o.   MRN: [id number]  Sleepy; comfortable; s/p cytotec 2mcg buccal x 1; no further severe BPs since right at her arrival in MAU around 0000  BPs 146/92, 136/77, 142/70, T 98.4 FHR 120-130s, +accels, no decels, Cat 1 Ctx irreg, mild Cx deferred  IUP@35 .1 Pre-e w SF PPROM x 7h IOL process  -Later this morning can eval for cervical foley placement/repeat cytotec (no cervical exam has been performed yet due to PPROM) -Continue to watch BPs closely and consider mag if they become severe range again  Myrtis Ser [facility] 05/30/2022 6:56 AM

## 2022-05-30 NOTE — Progress Notes (Signed)
Patient ID: Kristina Ferguson, female   DOB: [birth year], 22 y.o.   MRN: [id number]  Pt alternating between ambulation and rest, tolerating cramping well. Recheck of cervix= 1.5cm, will repeat oral cytotec dose and encourage continued ambulation.  Gaylan Gerold, CNM, MSN, [place] Certified Nurse Midwife, [facility]

## 2022-05-30 NOTE — Plan of Care (Signed)
  Problem: Education: Goal: Knowledge of disease or condition will improve Outcome: Progressing Goal: Knowledge of the prescribed therapeutic regimen will improve Outcome: Progressing   Problem: Fluid Volume: Goal: Peripheral tissue perfusion will improve Outcome: Progressing   Problem: Clinical Measurements: Goal: Complications related to disease process, condition or treatment will be avoided or minimized Outcome: Progressing   Problem: Education: Goal: Knowledge of Childbirth will improve Outcome: Progressing Goal: Ability to make informed decisions regarding treatment and plan of care will improve Outcome: Progressing Goal: Ability to state and carry out methods to decrease the pain will improve Outcome: Progressing Goal: Individualized Educational Video(s) Outcome: Progressing   Problem: Coping: Goal: Ability to verbalize concerns and feelings about labor and delivery will improve Outcome: Progressing   Problem: Life Cycle: Goal: Ability to make normal progression through stages of labor will improve Outcome: Progressing Goal: Ability to effectively push during vaginal delivery will improve Outcome: Progressing   Problem: Role Relationship: Goal: Will demonstrate positive interactions with the child Outcome: Progressing   Problem: Safety: Goal: Risk of complications during labor and delivery will decrease Outcome: Progressing   Problem: Pain Management: Goal: Relief or control of pain from uterine contractions will improve Outcome: Progressing   Problem: Education: Goal: Knowledge of General Education information will improve Description: Including pain rating scale, medication(s)/side effects and non-pharmacologic comfort measures Outcome: Progressing   Problem: Health Behavior/Discharge Planning: Goal: Ability to manage health-related needs will improve Outcome: Progressing   Problem: Clinical Measurements: Goal: Ability to maintain clinical measurements  within normal limits will improve Outcome: Progressing Goal: Will remain free from infection Outcome: Progressing Goal: Diagnostic test results will improve Outcome: Progressing Goal: Respiratory complications will improve Outcome: Progressing Goal: Cardiovascular complication will be avoided Outcome: Progressing   Problem: Activity: Goal: Risk for activity intolerance will decrease Outcome: Progressing   Problem: Nutrition: Goal: Adequate nutrition will be maintained Outcome: Progressing   Problem: Coping: Goal: Level of anxiety will decrease Outcome: Progressing   Problem: Elimination: Goal: Will not experience complications related to bowel motility Outcome: Progressing Goal: Will not experience complications related to urinary retention Outcome: Progressing   Problem: Pain Managment: Goal: General experience of comfort will improve Outcome: Progressing   Problem: Safety: Goal: Ability to remain free from injury will improve Outcome: Progressing   Problem: Skin Integrity: Goal: Risk for impaired skin integrity will decrease Outcome: Progressing   

## 2022-05-30 NOTE — MAU Note (Signed)
Vertex confirmed via Bedside US performed by Youlanda Roys. RN and supervised by Fatima Blank, CNM.

## 2022-05-30 NOTE — Progress Notes (Signed)
Patient ID: Kristina Ferguson, female   DOB: Mar 13, 2001, 22 y.o.   MRN: [id number]  Pt ambulating in room, now rates contractions at a 5/10. Baby much lower in the pelvis with reactive tracing, cervix still posterior but softer and now 3cm. Will begin Pitocin titration, pt can have epidural when ready. CBG continuing q4hr, last CBG=95.   Encouraged continued position changes. Anticipate SVD.  Gaylan Gerold, CNM, MSN, [place] Certified Nurse Midwife, [facility]

## 2022-05-30 NOTE — Progress Notes (Signed)
Patient ID: Kristina Ferguson, female   DOB: [birth year], 22 y.o.   MRN: [id number]  Labor Progress Note Kristina Ferguson is a 22 y.o. G1P0 at [redacted]w[redacted]d presented for PPROM with preeclampsia (P:Cr)  S:  Pt resting comfortably, only feeling mild menstrual type cramping.   O:  BP 138/89 (BP Location: Left Arm)   Pulse 84   Temp 98.4 F (36.9 C) (Oral)   Resp 14   Ht 5\' 7"  (1.702 m)   Wt 291 lb 9.6 oz (132.3 kg)   SpO2 97%   BMI 45.67 kg/m  EFM: baseline 130 bpm/ moderate variability/ 15x15 accels/ no decels  Toco/IUPC: UI SVE: Dilation: 1 Effacement (%): 60 Station: -2 Presentation: Vertex Exam by:: Candie Chroman CNM Pitocin: None  A/P: 22 y.o. G1P0 [redacted]w[redacted]d  1. Labor: Progressing well, 3cm soft outer os, will repeat 76mcg oral cytotec and encouraged ambulation 2. FWB: Cat 1 3. Pain: Planning epidural, currently not in pain and amenable to walking after breakfast 4. GDM: on carb modified diet, last non-fasting CBG=106  Anticipate SVD.  Gaylan Gerold, CNM, MSN, [place] Certified Nurse Midwife, [facility]

## 2022-05-30 NOTE — H&P (Signed)
Kristina Ferguson is a 22 y.o. female G1P0 at [redacted]w[redacted]d presenting for PPROM.  She reports gush of fluid around 10 pm on 05/29/22.  She has mild lower abdominal cramping that is unchanged since ROM.  Fluid is clear, pt reports normal fetal movement.    Pt with lapse in prenatal care with last visit 01/15/22, appointment scheduled next week on 06/01/22.    OB US MFM FU at 27 weeks with FHR 148, cephalic position, anterior placenta, AFI 10.2, EFW 55%   Nursing Staff Provider  Office Location [facility]  Dating    [facility] Model [ ]  Traditional [ ]  Centering [ ]  Mom-Baby Dyad    Language  English  Anatomy    Flu Vaccine   Genetic/Carrier Screen  NIPS:    AFP:  neg  Horizon:  TDaP Vaccine    Hgb A1C or  GTT Early ABNL Third trimester   COVID Vaccine    LAB RESULTS   Rhogam   Blood Type     Baby Feeding Plan Breast  Antibody    Contraception  Rubella    Circumcision Unsure  RPR     Pediatrician   HBsAg     Support Person Jamel(FOB)  HCVAb   Prenatal Classes  HIV       BTL Consent  GBS   (For PCN allergy, check sensitivities)   VBAC Consent  Pap        DME Rx [ ]  BP cuff [ ]  Weight Scale Waterbirth  [ ]  Class [ ]  Consent [ ]  CNM visit  PHQ9 & GAD7 [  ] new OB [  ] 28 weeks  [  ] 36 weeks Induction  [ ]  Orders Entered [ ] Foley Y/N   OB History     Gravida  1   Para      Term      Preterm      AB      Living         SAB      IAB      Ectopic      Multiple      Live Births             Past Medical History:  Diagnosis Date   Concussion without loss of consciousness 09/24/2014   Gestational diabetes    Past Surgical History:  Procedure Laterality Date   CHOLECYSTECTOMY     Family History: family history includes Asthma in her brother and sister; Hypertension in her maternal grandmother and mother; Stroke in her maternal grandmother. Social History:  reports that she has never smoked. She has never used smokeless tobacco. She reports that she does not drink  alcohol and does not use drugs.     Maternal Diabetes: Yes:  Diabetes Type:  Diet controlled Genetic Screening: Normal Maternal Ultrasounds/Referrals: Normal Fetal Ultrasounds or other Referrals:  None Maternal Substance Abuse:  No Significant Maternal Medications:  None Significant Maternal Lab Results:  Other:  Number of Prenatal Visits:greater than 3 verified prenatal visits Other Comments:   GBS unknown, swab pending  Review of Systems  Constitutional:  Negative for chills, fatigue and fever.  Eyes:  Negative for visual disturbance.  Respiratory:  Negative for shortness of breath.   Cardiovascular:  Negative for chest pain.  Gastrointestinal:  Positive for abdominal pain. Negative for vomiting.  Genitourinary:  Positive for vaginal discharge. Negative for difficulty urinating, dysuria, flank pain, pelvic pain, vaginal bleeding and vaginal pain.  Neurological:  Negative  for dizziness and headaches.  Psychiatric/Behavioral: Negative.     Maternal Medical History:  Reason for admission: Rupture of membranes.   Contractions: Frequency: irregular.   Perceived severity is mild.   Fetal activity: Perceived fetal activity is normal.   Prenatal complications: Obesity with prepregnancy BMI >04 Prenatal Complications - Diabetes: gestational. Diabetes is managed by diet.      Blood pressure (!) 149/90, pulse 93, temperature 98.3 F (36.8 C), temperature source Oral, resp. rate 16, height 5\' 7"  (1.702 m), weight 132.3 kg, SpO2 99 %. Patient Vitals for the past 24 hrs:  BP Temp Temp src Pulse Resp SpO2 Height Weight  05/30/22 0230 137/76 -- -- 83 -- 99 % -- --  05/30/22 0200 (!) 142/80 -- -- 91 -- 99 % -- --  05/30/22 0145 139/83 -- -- 84 -- 97 % -- --  05/30/22 0130 136/76 -- -- 88 -- 98 % -- --  05/30/22 0115 136/80 -- -- 91 -- 99 % -- --  05/30/22 0105 (!) 152/78 -- -- 85 -- 99 % -- --  05/30/22 0100 -- -- -- -- -- 99 % -- --  05/30/22 0045 (!) 160/94 -- -- 89 -- 99 % -- --   05/30/22 0030 (!) 158/89 -- -- 90 -- 99 % -- --  05/30/22 0015 (!) 149/90 -- -- 93 -- -- -- --  05/30/22 0000 (!) 163/97 -- -- 90 -- 99 % -- --  05/29/22 2355 -- -- -- -- -- 98 % -- --  05/29/22 2350 (!) 169/102 -- -- 90 -- 99 % -- --  05/29/22 2345 -- -- -- -- -- 99 % -- --  05/29/22 2340 -- -- -- -- -- 99 % -- --  05/29/22 2325 (!) 142/93 98.3 F (36.8 C) Oral 92 16 -- 5\' 7"  (1.702 m) 132.3 kg    Maternal Exam:  Uterine Assessment: Contraction strength is mild.  Contraction frequency is rare.    Fetal Exam Fetal Monitor Review: Mode: ultrasound.   Baseline rate: 140.  Variability: moderate (6-25 bpm).   Pattern: accelerations present and no decelerations.   Fetal State Assessment: Category I - tracings are normal.  Physical Exam Vitals and nursing note reviewed.  Constitutional:      Appearance: She is well-developed.  Cardiovascular:     Rate and Rhythm: Normal rate.     Heart sounds: Normal heart sounds.  Pulmonary:     Effort: Pulmonary effort is normal.     Breath sounds: Normal breath sounds.  Abdominal:     Palpations: Abdomen is soft.  Musculoskeletal:        General: Normal range of motion.     Cervical back: Normal range of motion.  Skin:    General: Skin is warm and dry.  Neurological:     Mental Status: She is alert and oriented to person, place, and time.  Psychiatric:        Behavior: Behavior normal.        Thought Content: Thought content normal.        Judgment: Judgment normal.    Prenatal labs: ABO, Rh: A/Positive/-- (08/14 0959) Antibody: Negative (08/14 0959) Rubella: 2.11 (08/14 0959) RPR: Non Reactive (08/14 0959)  HBsAg: Negative (08/14 0959)  HIV: Non Reactive (08/14 0959)  GBS:   unknown, collected on admission  Assessment/Plan: 22 y.o.  G1P0  at [redacted]w[redacted]d  presenting for PPROM Preeclampsia with severe features (P/C ratio and severe range BPs) GBS unknown Lapse in prenatal care Vertex  position confirmed by [place]  Admit to L&D Augment  labor PRN PCN for GBS unknown and preterm, PCR negative but culture pending Contraception undecided     Fatima Blank 05/30/2022, 12:32 AM

## 2022-05-31 ENCOUNTER — Inpatient Hospital Stay (HOSPITAL_COMMUNITY): Payer: Medicaid Other | Admitting: Anesthesiology

## 2022-05-31 ENCOUNTER — Encounter (HOSPITAL_COMMUNITY): Payer: Self-pay | Admitting: Family Medicine

## 2022-05-31 DIAGNOSIS — O42013 Preterm premature rupture of membranes, onset of labor within 24 hours of rupture, third trimester: Secondary | ICD-10-CM | POA: Diagnosis not present

## 2022-05-31 DIAGNOSIS — O134 Gestational [pregnancy-induced] hypertension without significant proteinuria, complicating childbirth: Secondary | ICD-10-CM | POA: Diagnosis not present

## 2022-05-31 DIAGNOSIS — O24420 Gestational diabetes mellitus in childbirth, diet controlled: Secondary | ICD-10-CM | POA: Diagnosis not present

## 2022-05-31 DIAGNOSIS — O24429 Gestational diabetes mellitus in childbirth, unspecified control: Secondary | ICD-10-CM | POA: Diagnosis not present

## 2022-05-31 DIAGNOSIS — Z3A35 35 weeks gestation of pregnancy: Secondary | ICD-10-CM | POA: Diagnosis not present

## 2022-05-31 DIAGNOSIS — O1414 Severe pre-eclampsia complicating childbirth: Secondary | ICD-10-CM | POA: Diagnosis not present

## 2022-05-31 LAB — CBC
HCT: 34.5 % — ABNORMAL LOW (ref 36.0–46.0)
Hemoglobin: 12.1 g/dL (ref 12.0–15.0)
MCH: 30.9 pg (ref 26.0–34.0)
MCHC: 35.1 g/dL (ref 30.0–36.0)
MCV: 88 fL (ref 80.0–100.0)
Platelets: 357 10*3/uL (ref 150–400)
RBC: 3.92 MIL/uL (ref 3.87–5.11)
RDW: 13.8 % (ref 11.5–15.5)
WBC: 15.6 10*3/uL — ABNORMAL HIGH (ref 4.0–10.5)
nRBC: 0 % (ref 0.0–0.2)

## 2022-05-31 LAB — GLUCOSE, CAPILLARY
Glucose-Capillary: 92 mg/dL (ref 70–99)
Glucose-Capillary: 98 mg/dL (ref 70–99)
Glucose-Capillary: 97 mg/dL (ref 70–99)
Glucose-Capillary: 87 mg/dL (ref 70–99)
Glucose-Capillary: 87 mg/dL (ref 70–99)

## 2022-05-31 MED ORDER — LACTATED RINGERS IV SOLN: INTRAVENOUS | Status: DC

## 2022-05-31 MED ORDER — IBUPROFEN 600 MG PO TABS
600.0000 mg | ORAL_TABLET | Freq: Four times a day (QID) | ORAL | Status: DC
  Administered 2022-06-01 – 2022-06-02 (×7): 600 mg via ORAL
  Filled 2022-05-31 (×9): qty 1

## 2022-05-31 MED ORDER — LIDOCAINE-EPINEPHRINE (PF) 2 %-1:200000 IJ SOLN
INTRAMUSCULAR | Status: DC | PRN
  Administered 2022-05-31: 3 mL via EPIDURAL

## 2022-05-31 MED ORDER — SENNOSIDES-DOCUSATE SODIUM 8.6-50 MG PO TABS
2.0000 | ORAL_TABLET | Freq: Every day | ORAL | Status: DC
  Administered 2022-06-01 – 2022-06-02 (×2): 2 via ORAL
  Filled 2022-05-31 (×2): qty 2

## 2022-05-31 MED ORDER — NIFEDIPINE 10 MG PO CAPS: 20.0000 mg | ORAL_CAPSULE | ORAL | Status: DC | PRN

## 2022-05-31 MED ORDER — MAGNESIUM SULFATE BOLUS VIA INFUSION
4.0000 g | Freq: Once | INTRAVENOUS | Status: AC
  Administered 2022-05-31: 4 g via INTRAVENOUS
  Filled 2022-05-31: qty 1000

## 2022-05-31 MED ORDER — ZOLPIDEM TARTRATE 5 MG PO TABS: 5.0000 mg | ORAL_TABLET | Freq: Every evening | ORAL | Status: DC | PRN

## 2022-05-31 MED ORDER — TETANUS-DIPHTH-ACELL PERTUSSIS 5-2.5-18.5 LF-MCG/0.5 IM SUSY
0.5000 mL | PREFILLED_SYRINGE | Freq: Once | INTRAMUSCULAR | Status: AC
  Administered 2022-06-02: 0.5 mL via INTRAMUSCULAR

## 2022-05-31 MED ORDER — BUPIVACAINE HCL (PF) 0.25 % IJ SOLN
INTRAMUSCULAR | Status: DC | PRN
  Administered 2022-05-31 (×2): 3 mL via EPIDURAL

## 2022-05-31 MED ORDER — ONDANSETRON HCL 4 MG/2ML IJ SOLN: 4.0000 mg | INTRAMUSCULAR | Status: DC | PRN

## 2022-05-31 MED ORDER — ONDANSETRON HCL 4 MG PO TABS
4.0000 mg | ORAL_TABLET | ORAL | Status: DC | PRN
  Administered 2022-06-01: 4 mg via ORAL
  Filled 2022-05-31: qty 1

## 2022-05-31 MED ORDER — COCONUT OIL OIL: 1.0000 | TOPICAL_OIL | Status: DC | PRN

## 2022-05-31 MED ORDER — SIMETHICONE 80 MG PO CHEW: 80.0000 mg | CHEWABLE_TABLET | ORAL | Status: DC | PRN

## 2022-05-31 MED ORDER — LABETALOL HCL 5 MG/ML IV SOLN: 40.0000 mg | INTRAVENOUS | Status: DC | PRN

## 2022-05-31 MED ORDER — NIFEDIPINE ER OSMOTIC RELEASE 30 MG PO TB24
30.0000 mg | ORAL_TABLET | Freq: Every day | ORAL | Status: DC
  Administered 2022-05-31: 30 mg via ORAL
  Filled 2022-05-31: qty 1

## 2022-05-31 MED ORDER — NIFEDIPINE ER OSMOTIC RELEASE 30 MG PO TB24
30.0000 mg | ORAL_TABLET | Freq: Every day | ORAL | Status: DC
  Administered 2022-06-01 – 2022-06-02 (×2): 30 mg via ORAL
  Filled 2022-05-31 (×2): qty 1

## 2022-05-31 MED ORDER — FUROSEMIDE 20 MG PO TABS
20.0000 mg | ORAL_TABLET | Freq: Every day | ORAL | Status: DC
  Administered 2022-05-31 – 2022-06-02 (×3): 20 mg via ORAL
  Filled 2022-05-31 (×3): qty 1

## 2022-05-31 MED ORDER — BENZOCAINE-MENTHOL 20-0.5 % EX AERO
1.0000 | INHALATION_SPRAY | CUTANEOUS | Status: DC | PRN
  Administered 2022-06-01: 1 via TOPICAL
  Filled 2022-05-31: qty 56

## 2022-05-31 MED ORDER — ACETAMINOPHEN 325 MG PO TABS: 650.0000 mg | ORAL_TABLET | ORAL | Status: DC | PRN

## 2022-05-31 MED ORDER — DIBUCAINE (PERIANAL) 1 % EX OINT: 1.0000 | TOPICAL_OINTMENT | CUTANEOUS | Status: DC | PRN

## 2022-05-31 MED ORDER — WITCH HAZEL-GLYCERIN EX PADS: 1.0000 | MEDICATED_PAD | CUTANEOUS | Status: DC | PRN

## 2022-05-31 MED ORDER — DIPHENHYDRAMINE HCL 25 MG PO CAPS: 25.0000 mg | ORAL_CAPSULE | Freq: Four times a day (QID) | ORAL | Status: DC | PRN

## 2022-05-31 MED ORDER — PRENATAL MULTIVITAMIN CH
1.0000 | ORAL_TABLET | Freq: Every day | ORAL | Status: DC
  Administered 2022-06-01 – 2022-06-02 (×2): 1 via ORAL
  Filled 2022-05-31 (×2): qty 1

## 2022-05-31 MED ORDER — MAGNESIUM SULFATE 40 GM/1000ML IV SOLN
2.0000 g/h | INTRAVENOUS | Status: AC
  Administered 2022-05-31 – 2022-06-01 (×2): 2 g/h via INTRAVENOUS
  Filled 2022-05-31 (×2): qty 1000

## 2022-05-31 MED ORDER — NIFEDIPINE 10 MG PO CAPS: 10.0000 mg | ORAL_CAPSULE | ORAL | Status: DC | PRN

## 2022-05-31 NOTE — Progress Notes (Signed)
Labor Progress Note Kristina Ferguson is a 22 y.o. G1P0 at [redacted]w[redacted]d presented for PPROM.   S: No acute concerns.   O:  BP 138/76   Pulse 88   Temp 98.6 F (37 C) (Oral)   Resp 20   Ht 5\' 7"  (1.702 m)   Wt 132.3 kg   SpO2 99%   BMI 45.67 kg/m  EFM: 130bpm/moderate/+accels, no decels  CVE: Dilation: 4.5 Effacement (%): 90 Station: -1 Presentation: Vertex Exam by:: Kristina Ferguson, CNM   A&P: 22 y.o. G1P0 [redacted]w[redacted]d here for PPROM #Labor: Progressing well. Expectant management. On 28 of pitocin.  #Pain: Epidural #FWB: Cat I  #GBS negative  PreE Mild range BP  A2GDM CBGs at goal.   Naaman Plummer Autry-Lott, DO 10:32 AM

## 2022-05-31 NOTE — Progress Notes (Signed)
Kristina Ferguson is a 21 y.o. G1P0 at [redacted]w[redacted]d admitted for PPROM.  Subjective: Patient now comfortable with epidural. Support people at bedside.  Objective: BP (!) 147/80   Pulse 84   Temp 98.2 F (36.8 C) (Axillary)   Resp 18   Ht 5\' 7"  (1.702 m)   Wt 132.3 kg   SpO2 99%   BMI 45.67 kg/m  No intake/output data recorded. No intake/output data recorded.  FHT:  FHR: 125 bpm, variability: moderate,  accelerations:  Present,  decelerations:  Absent UC:   regular, every 1 minutes SVE:   Dilation: 4.5 Effacement (%): 60 Station: -1 Exam by:: Baker Janus, RN  Labs: Lab Results  Component Value Date   WBC 10.3 05/30/2022   HGB 12.6 05/30/2022   HCT 37.2 05/30/2022   MCV 89.0 05/30/2022   PLT 387 05/30/2022    Assessment / Plan: Augmentation of labor, progressing well  Labor: Progressing normally on Pitocin Preeclampsia:  labs stable Fetal Wellbeing:  Category I Pain Control:  Epidural I/D:   GBS Neg Anticipated MOD:  NSVD  Laury Deep, CNM 05/31/2022, 1:50 AM

## 2022-05-31 NOTE — Progress Notes (Signed)
Labor Progress Note Kristina Ferguson is a 22 y.o. G1P0 at [redacted]w[redacted]d presented for PPROM.   S: No acute concerns.   O:  BP (!) 110/57   Pulse 95   Temp 98.4 F (36.9 C) (Oral)   Resp 16   Ht 5\' 7"  (1.702 m)   Wt 132.3 kg   SpO2 99%   BMI 45.67 kg/m  EFM: 130bpm/moderate/+accels, no decels  CVE: Dilation: Lip/rim Effacement (%): 90 Station: -1 Presentation: Vertex Exam by:: Dr Janus Molder   A&P: 22 y.o. G1P0 [redacted]w[redacted]d here for PPROM #Labor: Progressing well. S/p pit break due to prolong decels now on 4 of pitocin.  #Pain: Epidural #FWB: Cat I  #GBS negative  PreE Soft Bp  A2GDM CBGs at goal.   Gerlene Fee, DO 3:31 PM

## 2022-05-31 NOTE — Anesthesia Preprocedure Evaluation (Addendum)
Anesthesia Evaluation  Patient identified by MRN, date of birth, ID band Patient awake    Reviewed: Allergy & Precautions, Patient's Chart, lab work & pertinent test results  History of Anesthesia Complications Negative for: history of anesthetic complications  Airway Mallampati: III  TM Distance: >3 FB Neck ROM: Full    Dental no notable dental hx.    Pulmonary neg pulmonary ROS   breath sounds clear to auscultation       Cardiovascular hypertension,  Rhythm:Regular  gHTN   Neuro/Psych negative neurological ROS  negative psych ROS   GI/Hepatic negative GI ROS, Neg liver ROS,,,  Endo/Other  diabetes, Gestational    Renal/GU negative Renal ROS     Musculoskeletal   Abdominal   Peds  Hematology negative hematology ROS (+) Lab Results      Component                Value               Date                      WBC                      10.3                05/30/2022                HGB                      12.6                05/30/2022                HCT                      37.2                05/30/2022                MCV                      89.0                05/30/2022                PLT                      387                 05/30/2022             Denies blood thinners   Anesthesia Other Findings   Reproductive/Obstetrics (+) Pregnancy                             Anesthesia Physical Anesthesia Plan  ASA: 2  Anesthesia Plan: Epidural   Post-op Pain Management: Minimal or no pain anticipated   Induction:   PONV Risk Score and Plan: 2 and Treatment may vary due to age or medical condition  Airway Management Planned: Natural Airway  Additional Equipment: None  Intra-op Plan:   Post-operative Plan:   Informed Consent: I have reviewed the patients History and Physical, chart, labs and discussed the procedure including the risks, benefits and alternatives for the proposed  anesthesia with the patient or authorized representative who has indicated  his/her understanding and acceptance.       Plan Discussed with:   Anesthesia Plan Comments:        Anesthesia Quick Evaluation

## 2022-05-31 NOTE — Discharge Summary (Signed)
Postpartum Discharge Summary  Date of Service updated***     Patient Name: Kristina Ferguson DOB: January 09, 2001 MRN: [id number]  Date of admission: 05/29/2022 Delivery date:05/31/2022  Delivering provider: Gerlene Fee  Date of discharge: 05/31/2022  Admitting diagnosis: Indication for care in labor or delivery [O75.9] Intrauterine pregnancy: [redacted]w[redacted]d     Secondary diagnosis:  Principal Problem:   Preterm premature rupture of membranes (PPROM) Active Problems:   Morbid obesity ([place])   Gestational diabetes   Insufficient prenatal care in third trimester   Indication for care in labor or delivery   Pre-eclampsia, severe   Vaginal delivery   Shoulder dystocia, delivered  Additional problems: ***    Discharge diagnosis: {DX.:23714}                                              Post partum procedures:{Postpartum procedures:23558} Augmentation: Pitocin and Cytotec Complications: CZY>60 hours; Shoulder dystocia  Hospital course: Induction of Labor With Vaginal Delivery   22 y.o. yo G1P0 at [redacted]w[redacted]d was admitted to the hospital 05/29/2022 for induction of labor.  Indication for induction: PPROM.  Patient had an labor course complicated by 1.5 min shoulder dystocia. See delivery and NICU note.  Membrane Rupture Time/Date: 10:30 PM ,05/29/2022   Delivery Method:Vaginal, Spontaneous  Episiotomy: None  Lacerations:  2nd degree;Perineal  Details of delivery can be found in separate delivery note.  Patient had a postpartum course complicated by severe preE***. Patient is discharged home 05/31/22.  Newborn Data: Birth date:05/31/2022  Birth time:7:23 PM  Gender:Female  Living status:Living  Apgars:4 ,8  Weight:   Magnesium Sulfate received: Yes: Seizure prophylaxis BMZ received: No Rhophylac:No MMR:{MMR:30440033} T-DaP:{Tdap:23962} Flu: {YTK:16010} Transfusion:{Transfusion received:30440034}  Physical exam  Vitals:   05/31/22 1947 05/31/22 2002 05/31/22 2017 05/31/22 2032   BP: (!) 166/90 (!) 151/125 (!) 131/111 (!) 142/103  Pulse: 97 95 93 93  Resp: 20 18 18 18   Temp:      TempSrc:      SpO2:      Weight:      Height:       General: {Exam; general:21111117} Lochia: {Desc; appropriate/inappropriate:30686::"appropriate"} Uterine Fundus: {Desc; firm/soft:30687} Incision: {Exam; incision:21111123} DVT Evaluation: {Exam; dvt:2111122} Labs: Lab Results  Component Value Date   WBC 10.3 05/30/2022   HGB 12.6 05/30/2022   HCT 37.2 05/30/2022   MCV 89.0 05/30/2022   PLT 387 05/30/2022      Latest Ref Rng & Units 05/30/2022    6:55 PM  CMP  Glucose 70 - 99 mg/dL 91   BUN 6 - 20 mg/dL 8   Creatinine 0.44 - 1.00 mg/dL 0.51   Sodium 135 - 145 mmol/L 134   Potassium 3.5 - 5.1 mmol/L 3.7   Chloride 98 - 111 mmol/L 104   CO2 22 - 32 mmol/L 19   Calcium 8.9 - 10.3 mg/dL 8.8   Total Protein 6.5 - 8.1 g/dL 5.9   Total Bilirubin 0.3 - 1.2 mg/dL 0.4   Alkaline Phos 38 - 126 U/L 116   AST 15 - 41 U/L 23   ALT 0 - 44 U/L 19    Edinburgh Score:     No data to display           After visit meds:  Allergies as of 05/31/2022       Reactions   Aspirin Other (See Comments)  headaches     Med Rec must be completed prior to using this [facility]***        Discharge home in stable condition Infant Feeding: {Baby feeding:23562} Infant Disposition:{CHL IP OB HOME WITH YBWLSL:37342} Discharge instruction: per After Visit Summary and Postpartum booklet. Activity: Advance as tolerated. Pelvic rest for 6 weeks.  Diet: {OB AJGO:11572620} Future Appointments: Future Appointments  Date Time Provider [place]  06/01/2022  8:15 AM [facility] NURSE [facility]  06/01/2022  8:30 AM [facility] US2 [facility]  06/01/2022  3:15 PM Simona Huh [facility]  06/08/2022  8:30 AM [facility] NURSE [facility]  06/08/2022  8:45 AM [facility] US4 [facility] [place]   Follow up Visit:  Message sent to HP by Autry-Lott on 05/31/2022  Please schedule this  patient for a In person postpartum visit in 6 weeks with the following provider: MD and APP. Additional Postpartum F/U:2 hour GTT, BP check 1 week, and deep 2nd degree perineal laceration   High risk pregnancy complicated by: GDM and preE w/ SF and shoulder dystocia delivery Delivery mode:  Vaginal, Spontaneous  Anticipated Birth Control:  Unsure   05/31/2022 Naaman Plummer Autry-Lott, DO

## 2022-05-31 NOTE — Anesthesia Procedure Notes (Signed)
Epidural Patient location during procedure: OB Start time: 05/31/2022 12:50 AM End time: 05/31/2022 1:07 AM  Staffing Anesthesiologist: Oleta Mouse, MD Performed: anesthesiologist   Preanesthetic Checklist Completed: patient identified, IV checked, risks and benefits discussed, monitors and equipment checked, pre-op evaluation and timeout performed  Epidural Patient position: sitting Prep: DuraPrep Patient monitoring: heart rate, continuous pulse ox and blood pressure Approach: midline Location: L4-L5 Injection technique: LOR saline  Needle:  Needle type: Tuohy  Needle gauge: 17 G Needle length: 9 cm Needle insertion depth: 8 cm Catheter type: closed end flexible Catheter size: 19 Gauge Catheter at skin depth: 14 cm Test dose: negative and 2% lidocaine with Epi 1:200 K  Assessment Events: blood not aspirated, no cerebrospinal fluid, injection not painful, no injection resistance, no paresthesia and negative IV test

## 2022-05-31 NOTE — Progress Notes (Signed)
Kristina Ferguson is a 22 y.o. G1P0 at [redacted]w[redacted]d admitted for PPROM.  Subjective: Patient comfortable with epidural.   Objective: BP (!) 143/78   Pulse 86   Temp 97.7 F (36.5 C) (Axillary)   Resp 18   Ht 5\' 7"  (1.702 m)   Wt 132.3 kg   SpO2 99%   BMI 45.67 kg/m  No intake/output data recorded. Total I/O In: -  Out: 700 [Urine:700]  FHT:  FHR: 125 bpm, variability: moderate,  accelerations:  Present,  decelerations:  Absent UC:   irregular SVE:   Dilation: 4.5 Effacement (%): 90 Station: -1 Exam by:: Renato Battles, CNM Pitocin 20 milli-units/min  IUPC Insertion Procedure: Discussion of the importance of IUPC placement to adequately and efficiently up titrate Pitocin. The purpose of IUPC is to give the actual pressure of the inside of the uterus and to help manage the induction/augmentation process. The IUPC can also be used to flush fluids back in uterus, if it is determined that more fluid is needed. The risk of shearing of the placenta, bleeding and perforation of uterine wall are very low, but can still happen. Patient gives verbal consent to proceed with the procedure. IUPC placed without difficulty. No bleeding in return. Patient tolerated procedure well.   Labs: Lab Results  Component Value Date   WBC 10.3 05/30/2022   HGB 12.6 05/30/2022   HCT 37.2 05/30/2022   MCV 89.0 05/30/2022   PLT 387 05/30/2022    Assessment / Plan: Augmentation of labor, progressing well on Pitocin  Labor: Progressing normally Preeclampsia:  labs stable Fetal Wellbeing:  Category I Pain Control:  Epidural I/D:   GBS Negative Anticipated MOD:  NSVD  Laury Deep, CNM 05/31/2022, 6:07 AM

## 2022-06-01 ENCOUNTER — Encounter: Payer: Medicaid Other | Admitting: Advanced Practice Midwife

## 2022-06-01 ENCOUNTER — Encounter: Payer: Self-pay | Admitting: Obstetrics & Gynecology

## 2022-06-01 ENCOUNTER — Ambulatory Visit: Payer: Medicaid Other

## 2022-06-01 LAB — GLUCOSE, CAPILLARY: Glucose-Capillary: 102 mg/dL — ABNORMAL HIGH (ref 70–99)

## 2022-06-01 MED ORDER — INFLUENZA VAC SPLIT QUAD 0.5 ML IM SUSY
0.5000 mL | PREFILLED_SYRINGE | INTRAMUSCULAR | Status: AC
  Administered 2022-06-02: 0.5 mL via INTRAMUSCULAR
  Filled 2022-06-01: qty 0.5

## 2022-06-01 NOTE — Anesthesia Postprocedure Evaluation (Signed)
Anesthesia Post Note  Patient: Kristina Ferguson  Procedure(s) Performed: AN AD [place]     Patient location during evaluation: OB High Risk Anesthesia Type: Epidural Level of consciousness: awake, oriented and awake and alert Pain management: pain level controlled Vital Signs Assessment: post-procedure vital signs reviewed and stable Respiratory status: spontaneous breathing, respiratory function stable and nonlabored ventilation Cardiovascular status: stable Postop Assessment: no headache, adequate PO intake, able to ambulate, patient able to bend at knees and no apparent nausea or vomiting Anesthetic complications: no   No notable events documented.  Last Vitals:  Vitals:   06/01/22 0749 06/01/22 0750  BP: 136/76   Pulse: 78   Resp: 18   Temp: (!) 36.3 C   SpO2: 96% 96%    Last Pain:  Vitals:   06/01/22 0749  TempSrc: Oral  PainSc:    Pain Goal:                   Lorrie Gargan

## 2022-06-01 NOTE — Lactation Note (Signed)
This note was copied from a baby's chart. Lactation Consultation Note  Patient Name: Kristina Ferguson FIEPP'I Date: 06/01/2022 Reason for consult: Follow-up assessment;1st time breastfeeding;Primapara;Late-preterm 34-36.6wks Age:22 years  LC in to see P1 mother with GDM and GHTN on Mag Sulfate who delivered at 35.[redacted] weeks gestation. Mother speaks English and declines need for an interpreter.   Mother states she has been pumping every 3 hours while her mother is bottle feeding her baby. She has not collected milk yet. Informed that she can feed any quantity collected to baby by finger feeding, spoon, syringe if small amounts are expressed. Infant was seen today by SLP and started on Dr. Saul Fordyce Ultra Preemie bottle and nipple. Infant's feeding intake has improved from 2 ml to 18 ml with last feeding.   Mother has not placed baby to breast yet because he is sleepy. Praised for pumping and encouraged to call for latch assist when baby shows cues. Reviewed the LPTI feeding guidelines and mother voices understanding.   Mother is registered with [facility] in [place].  Referral for a DEBP will be made in efforts to get a pump for mother foloowing discharge.    Feeding Mother's Current Feeding Choice: Breast Milk and Formula Nipple Type: Dr. Myra Gianotti Preemie   Interventions Interventions: Education  Discharge Pump: DEBP;Hands Free (plans to order Nursi Luna Hands Free pump for home) [facility] Program: Yes  Consult Status Consult Status: Follow-up Date: 06/02/22 Follow-up type: In-patient   Stana Bunting M 06/01/2022, 2:06 PM

## 2022-06-01 NOTE — Lactation Note (Signed)
This note was copied from a baby's chart. Lactation Consultation Note  Patient Name: Kristina Ferguson [id number]'K Date: 06/01/2022 Reason for consult: Initial assessment;Late-preterm 34-36.6wks;1st time breastfeeding (Infant with hypoglycemia low blood sugar of 23 see lab results.) Age:21 hours  LC entered room, RN was giving infant sweet checks glucose gel due low blood sugar, infant not latched at breast yet  and only consumed 5 mls of 20 kcal previously. Birth Parent attempted latch infant and infant licked and taste and fell asleep at breast after one minute. LC explained not uncommon for LPTI. LC discussed hand expression using breast model and Birth Parent expressed 2 mls of colostrum that was spoon fed to infant, afterwards infant was cuing, LC used purple and clear bottle and infant given 22 kcal formula, infant only consumed 2 mls was doing a lot of tongue thrusting with uncoordinated suck. Infant may need a SLP consult. [place] set Birth Parent up with DEBP and Birth Parent was pumping when [place] left the room. LC discussed infant's input and output. Mom made aware of O/P services, breastfeeding support groups, community resources, and our phone # for post-discharge questions. Birth Parent knows that her EBM is safe at room temperature for 4 hrs, whereas formula must be used within 1 hour.   Birth Parent feeding plan: 1- LC discussed LP{TI feeding policy, BF infant 8+ times within 24 hours, STS and limit total feedings to 30 minutes or less, if infant latch at breast limit to 15 minutes or less and then  afterwards supplement infant with any EBM 1st and then 22 kcal formula. MY Plan Crib Card given and Birth Parent understands Day 1 infant needs (14 mls ) per feeding or more if infant wants it.  2- Birth Parent plans for [facility] to supplement infant  with EBM/ or 22 kcal formula while she is using the DEBP every 3 hours for 15 minutes. 3- Birth Parent will continue to attempt to latch infant at  the breast and if infant doesn't latch after few minutes she will stop and supplement infant with EBM/ formula to reserve infant's energy. 4- Birth Parent knows to call [place] for further latch assistance.   Maternal Data Has patient been taught Hand Expression?: Yes Does the patient have breastfeeding experience prior to this delivery?: No  Feeding Mother's Current Feeding Choice: Breast Milk and Formula Nipple Type: Extra Slow Flow  LATCH Score Latch: Too sleepy or reluctant, no latch achieved, no sucking elicited.  Audible Swallowing: None  Type of Nipple: Inverted  Comfort (Breast/Nipple): Soft / non-tender  Hold (Positioning): Assistance needed to correctly position infant at breast and maintain latch.  LATCH Score: 3   Lactation Tools Discussed/Used Tools: Pump Breast pump type: Double-Electric Breast Pump Pump Education: Setup, frequency, and cleaning;Milk Storage Reason for Pumping: Infant is LPTI Pumping frequency: Birth Parent will continue to pump every 3 hours for 15 minutes on inital setting.  Interventions Interventions: Breast feeding basics reviewed;Adjust position;Assisted with latch;Support pillows;Skin to skin;Position options;Breast massage;Expressed milk;Breast compression;Education;Pace feeding;LC Services brochure;LPT handout/interventions  Discharge    Consult Status Consult Status: Follow-up Date: 06/01/22 Follow-up type: In-patient    Eulis Canner 06/01/2022, 12:29 AM

## 2022-06-01 NOTE — Progress Notes (Signed)
Post Partum Day 1 s/p vaginal delivery at [redacted]w[redacted]d complicated by severe preeclampsia, A1GDM  Subjective: No complaints and tolerating PO. Has foley in place, will be discontinued soon.  Patient denies any headaches, visual symptoms, RUQ/epigastric pain or other concerning symptoms.  Objective: Blood pressure (!) 146/72, pulse 74, temperature 97.7 F (36.5 C), temperature source Oral, resp. rate 15, height 5\' 7"  (1.702 m), weight 132.3 kg, SpO2 99 %, unknown if currently breastfeeding.  Physical Exam:  General: alert and no distress Lochia: appropriate Uterine Fundus: firm DVT Evaluation: No evidence of DVT seen on physical exam.Negative Homan's sign. No cords or calf tenderness. No significant calf/ankle edema.  Recent Labs    05/30/22 1843 05/31/22 2136  HGB 12.6 12.1  HCT 37.2 34.5*   Assessment/Plan: Stable BP on Procardia 30 XL daily, Lasix 20 mg daily Breastfeeding OOB encouraged Discussed birth control methods, information sent to her MyChart Patient desires circumcision for her female infant.  Circumcision procedure details discussed, risks and benefits of procedure were also discussed.  These include but are not limited to: Benefits of circumcision in men include reduction in the rates of urinary tract infection (UTI), penile cancer, some sexually transmitted infections, penile inflammatory and retractile disorders, as well as easier hygiene.  Risks include bleeding , infection, injury of glans which may lead to penile deformity or urinary tract issues, unsatisfactory cosmetic appearance and other potential complications related to the procedure.  It was emphasized that this is an elective procedure.  Patient wants to proceed with circumcision; written informed consent obtained.  Will do circumcision pending pediatrician evaluation, routine circumcision and post circumcision care ordered for the infant. Plan for discharge tomorrow Continue routine postpartum care.   LOS: 2 days      Verita Schneiders, MD 06/01/2022, 7:42 AM

## 2022-06-02 ENCOUNTER — Other Ambulatory Visit (HOSPITAL_COMMUNITY): Payer: Self-pay

## 2022-06-02 ENCOUNTER — Ambulatory Visit: Payer: Self-pay

## 2022-06-02 LAB — SURGICAL PATHOLOGY

## 2022-06-02 MED ORDER — IBUPROFEN 600 MG PO TABS
600.0000 mg | ORAL_TABLET | Freq: Four times a day (QID) | ORAL | 0 refills | Status: AC | PRN
  Filled 2022-06-02: qty 60, 15d supply, fill #0

## 2022-06-02 MED ORDER — NIFEDIPINE ER 30 MG PO TB24
30.0000 mg | ORAL_TABLET | Freq: Every day | ORAL | 2 refills | Status: AC
  Filled 2022-06-02: qty 30, 30d supply, fill #0

## 2022-06-02 MED ORDER — FUROSEMIDE 20 MG PO TABS
20.0000 mg | ORAL_TABLET | Freq: Every day | ORAL | 0 refills | Status: AC
  Filled 2022-06-02: qty 3, 3d supply, fill #0

## 2022-06-02 MED ORDER — SENNOSIDES-DOCUSATE SODIUM 8.6-50 MG PO TABS
2.0000 | ORAL_TABLET | Freq: Every evening | ORAL | 2 refills | Status: AC | PRN
  Filled 2022-06-02: qty 30, 15d supply, fill #0

## 2022-06-02 NOTE — Progress Notes (Signed)
Reviewed discharge instructions with patient including s/s of preeclampsia to report to her provider.  Patient is enrolled in [employer] program, but did not have a BP cuff.  Gave patient BP cuff and she completed return demonstration for correctly taking BP.  Patient nor RN were able to see on Baby Scripts app where to record BP.  Gave report to oncoming RN, M. Aube, who will call Baby [employer]s tech support and complete the process before patient is discharged.  Patient to remain in room with her baby who is not being discharged today.

## 2022-06-02 NOTE — Clinical Social Work Maternal (Signed)
CLINICAL SOCIAL WORK MATERNAL/CHILD NOTE  Patient Details  Name: Kristina Ferguson MRN: [id number] Date of Birth: April 12, 2001  Date:  06/02/2022  Clinical Social Worker Initiating Note:  Abundio Miu, [place] Date/Time: Initiated:  06/02/22/0920     Child's Name:  Kristina Ferguson   Biological Parents:  Mother, Father Cleda Mccreedy 03/17/79)   Need for Interpreter:  None   Reason for Referral:  Other (Comment) (Limited Prenatal Care)   Address:  [address] [place] [address]    Phone number:  [phone] (home)     Additional phone number:   Household Members/Support Persons (HM/SP):   Household Member/Support Person 1, Household Member/Support Person 2, Household Member/Support Person 3   HM/SP Name Relationship DOB or Age  HM/SP -78 [place] mom    HM/SP -2   brother    HM/SP -3   brother    HM/SP -4        HM/SP -5        HM/SP -6        HM/SP -7        HM/SP -8          Natural Supports (not living in the home):  Immediate Family, Extended Family   Professional Supports: None   Employment: Animator   Type of Work: Geophysicist/field seismologist:  [place] arranged:    Museum/gallery curator Resources:  Medicaid   Other Resources:  [facility]   Cultural/Religious Considerations Which May Impact Care:    Strengths:  Ability to meet basic needs  , Home prepared for child  , Pediatrician chosen   Psychotropic Medications:         Pediatrician:    [place] area  Pediatrician List:   [facility] [place]      Pediatrician Fax Number:    Risk Factors/Current Problems:  None   Cognitive State:  Alert  , Able to Concentrate  , Goal Oriented  , Linear Thinking     Mood/Affect:  Calm  , Bright  , Happy  , Relaxed  , Comfortable     CSW Assessment: CSW met with MOB at bedside to complete psychosocial  assessment. CSW introduced self and explained reason for consult. MOB was welcoming, pleasant, and remained engaged during assessment. MOB reported that she resides with her mother and two brothers. MOB reported that she receives [facility] and has all items needed to care for infant including a car seat and crib. CSW inquired about MOB's support system. MOB reported that she has good support system, including her mom, sisters, and family.  CSW inquired about MOB's mental health history. MOB denied any mental health history. CSW inquired about how MOB was feeling emotionally since giving birth, MOB reported that she was feeling good. MOB presented calm and did not demonstrate any acute mental health signs/symptoms. CSW assessed for safety, MOB denied SI, HI, and domestic violence.   CSW provided education regarding the baby blues period vs. perinatal mood disorders, discussed treatment and gave resources for mental health follow up if concerns arise.  CSW recommends self-evaluation during the postpartum time period using the New Mom Checklist from Postpartum Progress and encouraged MOB to contact a medical professional if symptoms are noted at any time.   CSW provided review of Sudden Infant Death Syndrome (SIDS) precautions.  CSW identifies no further need for intervention and no barriers to discharge at this time.  CSW informed MOB about the hospital drug screen policy due to limited prenatal care. MOB denied any substance use during pregnancy. MOB reported that she had more than four prenatal visits dating back to June. MOB denied any barriers with getting prenatal care and denied any barriers with getting infant to pediatric appointments. CSW explained that only three visits were showing up in MOB's chart (two prenatal care visits and one MFM visit). CSW explained that infant's UDS and CDS would be monitored and a CPS report would be made if warranted. MOB verbalized understanding and denied any questions.     CSW provided review of Sudden Infant Death Syndrome (SIDS) precautions.    CSW identifies no further need for intervention and no barriers to discharge at this time.   CSW Plan/Description:  Sudden Infant Death Syndrome (SIDS) Education, Perinatal Mood and Anxiety Disorder (PMADs) Education, No Further Intervention Required/No Barriers to Discharge, Hospital Drug Screen Policy Information, CSW Will Continue to Monitor Umbilical Cord Tissue Drug Screen Results and Make Report if Warranted    Glen Blatchley L Laria Grimmett, LCSW 06/02/2022, 9:22 AM 

## 2022-06-02 NOTE — Lactation Note (Signed)
This note was copied from a baby's chart. Lactation Consultation Note  Patient Name: Kristina Ferguson [id number]'J Date: 06/02/2022 Reason for consult: Follow-up assessment;Primapara;Late-preterm 34-36.6wks Age:21 hours Mom sitting in recliner awake holding baby. Asked mom how BF is going mom stated it takes a little bit to get him latched then he does good. Mom is BF for 15 minutes then giving formula. Reminded mom to increase supplemental amount w/age. Asked mom if she is pumping mom stated no she has been BF and hand expressing for stimulation. Reviewed LPI don't stimulate breast as much as full term babies so recommendation is to also pump until they would be full term. Mom has a hands free pump at home kinda like what we have here.  Mom is BF STS. Praised mom. Encouraged mom to call for [facility] when she is going to feed baby so we can see a latch and help w/any assistance or questions. Maternal Data    Feeding Mother's Current Feeding Choice: Breast Milk and Formula  LATCH Score                    Lactation Tools Discussed/Used    Interventions    Discharge    Consult Status Consult Status: Follow-up Date: 06/03/22 Follow-up type: In-patient    [name] 06/02/2022, 10:25 PM

## 2022-06-03 ENCOUNTER — Ambulatory Visit: Payer: Self-pay

## 2022-06-03 ENCOUNTER — Encounter: Payer: Self-pay | Admitting: Obstetrics and Gynecology

## 2022-06-03 NOTE — Lactation Note (Signed)
This note was copied from a baby's chart. Lactation Consultation Note  Patient Name: Kristina Ferguson Date: 06/03/2022  Reason for consult: Follow-up assessment;Late-preterm 34-36.6wks;1st time breastfeeding;Primapara Age:21 hours  P1, 35.2 gestational age  LC in to see mother of LPTI. Baby was started on double phototherapy today. Mother reports she has been latching baby and supplementing with formula. Infant is tolerating feedings well with Dr. Saul Fordyce preemie nipple.  Baby recently fed at breast for 8 minutes in bili blanket. He became fussy and mother was able to hand express 5 ml into the bottle to feed baby. She said he was sleepy afterwards and did not want additional formula.  Discussed due to gestational age and jaundice, baby needs to continue to get total feeding of 30 ml/ feeding. Instructed to give expressed milk first and then add formula to equal 30 ml intake.   Mother had questions regarding milk storage. She reports [facility]IC called her today and she can come by [facility] on her way home to pick up a DEBP .  Latch has not be observed. Instructed to with breastfeeding for RN/LC to observe baby feeding at the breast.  Feeding Mother's Current Feeding Choice: Breast Milk and Formula Nipple Type: Dr. Clement Husbands    Consult Status Consult Status: Follow-up Date: 06/04/22 Follow-up type: In-patient    Stana Bunting M 06/03/2022, 12:53 PM

## 2022-06-04 LAB — GLUCOSE, CAPILLARY: Glucose-Capillary: 104 mg/dL — ABNORMAL HIGH (ref 70–99)

## 2022-06-06 ENCOUNTER — Ambulatory Visit: Payer: Self-pay

## 2022-06-06 NOTE — Lactation Note (Signed)
This note was copied from a baby's chart. Lactation Consultation Note  Patient Name: Kristina Ferguson [id number] Date: 06/06/2022 Reason for consult: Follow-up assessment;1st time breastfeeding;Primapara;Late-preterm 34-36.6wks;Other (Comment);Hyperbilirubinemia;Infant < 6lbs;Infant weight loss (Baby patient) Age:22 years  Visited with family of 21 days old LPI NICU female; Ms. Kristina Ferguson is a P1 and reports her milk is in, she's pumping but not consistently. Explained the importance of consistent pumping for the prevention of engorgement and to protect her supply. No S/S of engorgement at this time. She hasn't been taking baby to breast due to triple phototherapy but her plan is to do so once baby is out of the bili blanket. She has been pumping and bottle feeding with both EBM and Similac 22 calorie formula. Reviewed pumping schedule, pump settings, lactogenesis II/III, newborn jaundice and anticipatory guidelines.  Feeding Mother's Current Feeding Choice: Breast Milk and Formula Nipple Type: Dr. Clement Ferguson  Lactation Tools Discussed/Used Tools: Pump Breast pump type: Double-Electric Breast Pump Pump Education: Setup, frequency, and cleaning;Milk Storage Reason for Pumping: LPI < 6 lbs as on 06/06/22 Pumping frequency: 3 times/24 hours Pumped volume: 60 mL (60-70 ml)  Interventions Interventions: Breast feeding basics reviewed;DEBP;Education  Discharge Discharge Education: Engorgement and breast care  Plan of care Encouraged bilateral pumping every 3 hours, ideally 8 times in 24 hours or whenever baby is getting a bottle She'll switch her pump setting from initiation to expression mode She'll pick up her pump from the [facility] office on 06/08/2022  No other support person at this time. All questions and concerns answered, family to contact [facility] services PRN.  Consult Status Consult Status: Follow-up Date: 06/07/22 Follow-up type: In-patient   [place] 06/06/2022, 1:16  PM

## 2022-06-07 ENCOUNTER — Ambulatory Visit: Payer: Self-pay

## 2022-06-07 NOTE — Lactation Note (Signed)
This note was copied from a baby's chart. Lactation Consultation Note  Patient Name: Kristina Ferguson [id number] Date: 06/07/2022  Reason for consult: 1st time breastfeeding;Primapara;Late-preterm 34-36.6wks Age:21 days  P1 mother, infant born at 72.2 weeks who is now 20 days old. Triple phototherapy has been discontinued. Mother reports she is latching baby to breast prior to supplementation but not not consistent. Mother recently fed baby 60 ml of formula. She has 60 ml of expressed milk that she recently pumped. Advised mother that consistency with pumping and supplementing would help her maintain her milk supply and avoid engorgement. Mother praised for her care and feeding of her newborn, making adjustments with feedings based on infant's needs.Mother reports she has been pumping 1 hour prior to feeding baby. Recommended that she alters her pumping schedule with anticipation of discharge.  Encouraged to: Latch baby to breast, feeding him while he is actively sucking and swallowing. Stop when he shows fatigue. Supplement with expressed breast milk, formula when EBM is not available. Pump following infant feeding to provide breast milk for the next feeding. Pumping every 3 hours for 15 minutes.  Discussed having an OP [place] appointment to evaluate milk transfer and supplemental guidelines. Mother receptive to OP referral and referral made. Mother plans to obtain [facility] DEBP. She does have a personal pump when she goes home.   Feeding Mother's Current Feeding Choice: Breast Milk and Formula    Lactation Tools Discussed/Used Tools: Pump;Flanges;Bottle Breast pump type: Double-Electric Breast Pump Pump Education: Setup, frequency, and cleaning;Milk Storage Reason for Pumping: LPI Pumping frequency: 3-4 times/ 24 hours per mother Pumped volume: 60 mL  Interventions Interventions: [place] [facility] brochure  Discharge Discharge Education: Engorgement and breast care;Outpatient  recommendation;Outpatient Epic message sent Pump: DEBP;Personal (mother to pick up [place] when discharged)  Consult Status Consult Status: Follow-up Date: 06/08/22 Follow-up type: In-patient    Stana Bunting M 06/07/2022, 12:01 PM

## 2022-06-08 ENCOUNTER — Other Ambulatory Visit: Payer: Medicaid Other

## 2022-06-08 ENCOUNTER — Ambulatory Visit: Payer: Medicaid Other

## 2022-06-10 ENCOUNTER — Ambulatory Visit: Payer: Medicaid Other

## 2022-06-11 ENCOUNTER — Telehealth (HOSPITAL_COMMUNITY): Payer: Self-pay | Admitting: *Deleted

## 2022-06-11 NOTE — Telephone Encounter (Signed)
Hospital Discharge Follow-Up Call:  Patient reports that she is well and has no concerns about her healing process.  EPDS today was 0 and she endorses this accurately reflects that she is doing well emotionally.  Patient says that baby is well and she has no concern about baby's health.  She reports that baby sleeps in a crib.  Reviewed ABCs of Safe Sleep.

## 2022-06-24 ENCOUNTER — Telehealth: Payer: Self-pay | Admitting: Family Medicine

## 2022-06-24 NOTE — Telephone Encounter (Signed)
Patient requesting a letter to return to work.

## 2022-06-25 ENCOUNTER — Encounter: Payer: Self-pay | Admitting: Family Medicine

## 2022-07-03 ENCOUNTER — Inpatient Hospital Stay (HOSPITAL_COMMUNITY): Admit: 2022-07-03 | Payer: Self-pay

## 2022-07-08 ENCOUNTER — Ambulatory Visit: Payer: Medicaid Other | Admitting: Family Medicine

## 2022-07-09 ENCOUNTER — Ambulatory Visit: Payer: Medicaid Other | Admitting: Family Medicine

## 2022-07-13 ENCOUNTER — Other Ambulatory Visit: Payer: Self-pay | Admitting: General Practice

## 2022-07-13 ENCOUNTER — Ambulatory Visit: Payer: Self-pay | Admitting: Family Medicine

## 2022-07-13 ENCOUNTER — Other Ambulatory Visit: Payer: Medicaid Other

## 2022-07-13 DIAGNOSIS — Z8632 Personal history of gestational diabetes: Secondary | ICD-10-CM

## 2024-02-09 ENCOUNTER — Ambulatory Visit (HOSPITAL_COMMUNITY): Admission: EM | Admit: 2024-02-09 | Discharge: 2024-02-09 | Discharge status: Against Medical Advice | Payer: Self-pay

## 2024-02-09 NOTE — ED Notes (Signed)
 Patient called x 1. No answer. Call went directly to voice mail. Patient was not in the lobby either.

## 2024-02-09 NOTE — ED Notes (Signed)
Call patient x 2 no answer 

## 2024-03-22 ENCOUNTER — Ambulatory Visit
